# Patient Record
Sex: Male | Born: 1935 | Race: White | Hispanic: No | Marital: Married | Smoking: Former smoker
Health system: Southern US, Community
[De-identification: ages and names within clinical notes are randomized; demographics above are authoritative.]

## PROBLEM LIST (undated history)

## (undated) DIAGNOSIS — J40 Bronchitis, not specified as acute or chronic: Secondary | ICD-10-CM

## (undated) DIAGNOSIS — H3530 Unspecified macular degeneration: Secondary | ICD-10-CM

## (undated) DIAGNOSIS — C4490 Unspecified malignant neoplasm of skin, unspecified: Secondary | ICD-10-CM

## (undated) DIAGNOSIS — I10 Essential (primary) hypertension: Secondary | ICD-10-CM

## (undated) DIAGNOSIS — Z85528 Personal history of other malignant neoplasm of kidney: Secondary | ICD-10-CM

## (undated) DIAGNOSIS — T7840XA Allergy, unspecified, initial encounter: Secondary | ICD-10-CM

## (undated) DIAGNOSIS — M1990 Unspecified osteoarthritis, unspecified site: Secondary | ICD-10-CM

## (undated) DIAGNOSIS — F419 Anxiety disorder, unspecified: Secondary | ICD-10-CM

## (undated) DIAGNOSIS — N289 Disorder of kidney and ureter, unspecified: Secondary | ICD-10-CM

## (undated) HISTORY — DX: Disorder of kidney and ureter, unspecified: N28.9

## (undated) HISTORY — PX: TRANSURETHRAL RESECTION OF PROSTATE: SHX73

## (undated) HISTORY — DX: Bronchitis, not specified as acute or chronic: J40

## (undated) HISTORY — DX: Personal history of other malignant neoplasm of kidney: Z85.528

## (undated) HISTORY — DX: Allergy, unspecified, initial encounter: T78.40XA

## (undated) HISTORY — DX: Unspecified osteoarthritis, unspecified site: M19.90

## (undated) HISTORY — DX: Anxiety disorder, unspecified: F41.9

## (undated) HISTORY — DX: Unspecified macular degeneration: H35.30

## (undated) HISTORY — DX: Unspecified malignant neoplasm of skin, unspecified: C44.90

## (undated) HISTORY — DX: Essential (primary) hypertension: I10

---

## 2005-03-30 ENCOUNTER — Ambulatory Visit: Payer: Self-pay | Admitting: Unknown Physician Specialty

## 2005-09-20 ENCOUNTER — Ambulatory Visit: Payer: Self-pay | Admitting: Internal Medicine

## 2005-09-22 ENCOUNTER — Ambulatory Visit: Payer: Self-pay | Admitting: General Surgery

## 2005-09-22 ENCOUNTER — Other Ambulatory Visit: Payer: Self-pay

## 2005-09-29 ENCOUNTER — Ambulatory Visit: Payer: Self-pay | Admitting: General Surgery

## 2007-01-18 ENCOUNTER — Emergency Department: Payer: Self-pay | Admitting: Emergency Medicine

## 2009-06-30 ENCOUNTER — Ambulatory Visit: Payer: Self-pay | Admitting: Otolaryngology

## 2010-05-20 ENCOUNTER — Ambulatory Visit: Payer: Self-pay

## 2010-06-02 ENCOUNTER — Ambulatory Visit: Payer: Self-pay | Admitting: Unknown Physician Specialty

## 2010-06-22 ENCOUNTER — Ambulatory Visit: Payer: Self-pay | Admitting: Unknown Physician Specialty

## 2011-05-09 ENCOUNTER — Ambulatory Visit: Payer: Self-pay | Admitting: Internal Medicine

## 2011-08-18 ENCOUNTER — Ambulatory Visit: Payer: Self-pay | Admitting: Internal Medicine

## 2011-09-05 ENCOUNTER — Ambulatory Visit: Payer: Self-pay | Admitting: General Practice

## 2011-09-09 ENCOUNTER — Ambulatory Visit: Payer: Self-pay | Admitting: General Practice

## 2013-11-28 ENCOUNTER — Ambulatory Visit: Payer: Self-pay | Admitting: Internal Medicine

## 2013-12-23 ENCOUNTER — Ambulatory Visit: Payer: Self-pay | Admitting: Unknown Physician Specialty

## 2014-01-07 ENCOUNTER — Ambulatory Visit: Payer: Self-pay | Admitting: Internal Medicine

## 2014-05-20 NOTE — Op Note (Signed)
PATIENT NAME:  Jeff Patel, Jeff Patel MR#:  [id number] DATE OF BIRTH:  1928/09/29  DATE OF PROCEDURE:  09/09/2011  PREOPERATIVE DIAGNOSIS: Internal derangement of the right knee.   POSTOPERATIVE DIAGNOSES:  1. Tear of the posterior horn medial meniscus, right knee.  2. Tear of the anterior and posterior horns of the lateral meniscus, right knee.  3. 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 4. Grade III to IV chondromalacia involving the lateral compartment.   PROCEDURES PERFORMED:  1. Right knee arthroscopy. 2. Partial medial and lateral meniscectomies.  3. Chondroplasties of the medial and lateral compartments.   SURGEON: Laurice Record. Holley Bouche., MD   ANESTHESIA: General.   ESTIMATED BLOOD LOSS: Minimal.   TOURNIQUET TIME: Not used.   DRAINS: None.   INDICATIONS FOR SURGERY: The patient is an 81 year old male who has been seen for complaints of right knee pain. MRI demonstrated findings consistent with meniscal pathology as well as some degenerative change. After discussion of the risks and benefits of surgical intervention, the patient expressed his understanding of the risks and benefits and agreed with plans for surgical intervention.   PROCEDURE IN DETAIL: The patient was brought to the operating room and, after adequate general anesthesia was achieved, a tourniquet was placed on the patient's right thigh and the leg was placed in a legholder. All bony prominences were well padded. The patient's right knee and leg were cleaned and prepped with alcohol and DuraPrep and draped in the usual sterile fashion. A "time-out" was performed as per usual protocol. The anticipated portal sites were injected with 0.25% Marcaine with epinephrine. An anterolateral portal was created and a cannula was inserted. A small effusion was evacuated. The scope was inserted and the knee was distended using the DePuy Mitek pump. The scope was advanced down the medial gutter into the medial compartment  of the knee. Under visualization with the scope, an anteromedial portal was created and hook probe was inserted. Inspection of the medial compartment demonstrated a complex tear of the posterior horn of the medial meniscus. This was debrided using meniscal punches and a 4.5 mm shaver. Final contouring was performed using a 50 degree ArthroCare wand. There was noted to be an area of grade IV chondromalacia involving the posteromedial aspect of the medial tibial plateau. Margins were debrided and contoured using the 50 degree ArthroCare wand. There was an associated area of grade IV chondromalacia along the medial aspect of the medial femoral condyle. This area was also debrided and contoured using the ArthroCare wand. Lesser changes were noted to both the femoral condyle and tibial plateau and these were subsequently debrided using the wand. Scope was then advanced into the intercondylar region. Anterior cruciate ligament was visualized and probed and felt to be stable. Scope was removed from the anterolateral portal and reinserted via the anteromedial portal so as to better visualize the lateral compartment. Complex degenerative tear was noted to the anterior horn of the lateral meniscus. This was debrided using a 4.5 mm incisor shaver with additional contouring performed using the 50 degree ArthroCare wand. There was also noted to be significant flap-type tear along the posterior horn of the lateral meniscus. This was debrided using meniscal punches and a 4.5 mm shaver. Contouring of the remaining rim of meniscus was performed using the 50 degree ArthroCare wand. The remaining meniscus was probed and felt to be stable. There was an area of grade III to early grade IV chondromalacia involving the lateral femoral condyle and these areas were debrided  and contoured using the ArthroCare wand. Finally, the scope was positioned so as to visualize the patellofemoral articulation. Good patellar tracking was noted. The  articular surface was in reasonably good condition.   The knee was irrigated with copious amounts of fluid and then suctioned dry. The anterolateral portal was reapproximated using #3-0 nylon. A combination of 0.25% Marcaine with epinephrine and 4 mg of morphine was injected via the scope. The scope was removed and the anteromedial portal was reapproximated using #3-0 nylon. Sterile dressing was applied followed by application of an ice wrap.   The patient tolerated the procedure well. He was transported to the recovery room in stable condition.   ____________________________ Laurice Record. Holley Bouche., MD jph:drc D: 09/09/2011 15:10:08 ET T: 09/09/2011 15:39:00 ET JOB#: [id number] Laurice Record Holley Bouche MD ELECTRONICALLY SIGNED 09/11/2011 9:18

## 2014-08-01 ENCOUNTER — Other Ambulatory Visit: Payer: Self-pay | Admitting: Internal Medicine

## 2014-08-01 DIAGNOSIS — M4310 Spondylolisthesis, site unspecified: Secondary | ICD-10-CM

## 2014-08-12 ENCOUNTER — Ambulatory Visit: Admission: RE | Admit: 2014-08-12 | Payer: Medicare Other | Source: Ambulatory Visit

## 2014-08-20 ENCOUNTER — Ambulatory Visit
Admission: RE | Admit: 2014-08-20 | Discharge: 2014-08-20 | Disposition: A | Discharge status: Home / Self Care | Payer: Medicare Other | Source: Ambulatory Visit | Attending: Internal Medicine | Admitting: Internal Medicine

## 2014-08-20 DIAGNOSIS — M4802 Spinal stenosis, cervical region: Secondary | ICD-10-CM | POA: Diagnosis not present

## 2014-08-20 DIAGNOSIS — M4310 Spondylolisthesis, site unspecified: Secondary | ICD-10-CM | POA: Diagnosis present

## 2014-08-20 MED ORDER — GADOBENATE DIMEGLUMINE 529 MG/ML IV SOLN
20.0000 mL | Freq: Once | INTRAVENOUS | Status: AC | PRN
  Administered 2014-08-20: 20 mL via INTRAVENOUS

## 2014-10-15 ENCOUNTER — Ambulatory Visit: Payer: Medicare Other | Attending: Neurosurgery

## 2014-10-15 VITALS — BP 123/66 | HR 67

## 2014-10-15 DIAGNOSIS — M542 Cervicalgia: Secondary | ICD-10-CM | POA: Insufficient documentation

## 2014-10-15 DIAGNOSIS — R531 Weakness: Secondary | ICD-10-CM | POA: Diagnosis present

## 2014-10-15 NOTE — Therapy (Signed)
[place] [facility] [address], [place], [address] Phone: [phone]   Fax:  [phone]  Physical Therapy Evaluation  Patient Details  Name: Jeff Patel MRN: [id number] Date of Birth: [birth year] Referring Provider:  Karie Chimera, MD  Encounter Date: 10/15/2014      PT End of Session - 10/15/14 1453    Visit Number 1   Number of Visits 9   Date for PT Re-Evaluation 11/13/14   Authorization Type 1   Authorization Time Period of 10   PT Start Time 1454   PT Stop Time 1602   PT Time Calculation (min) 68 min   Activity Tolerance Patient tolerated treatment well   Behavior During Therapy [facility] for tasks assessed/performed      Past Medical History  Diagnosis Date  . Anxiety   . Hypertension     takes medication  . Arthritis     Osteoarthritis  . Allergy   . Bronchitis   . Kidney disease   . History of kidney cancer     Ablation surgery. Has a follow up 11/2014  . Skin cancer   . Macular degeneration     Past Surgical History  Procedure Laterality Date  . Transurethral resection of prostate      Filed Vitals:   10/15/14 1455  BP: 123/66  Pulse: 67    Visit Diagnosis:  Cervicalgia - Plan: PT plan of care cert/re-cert  Weakness - Plan: PT plan of care cert/re-cert      Subjective Assessment - 10/15/14 1455    Subjective Neck pain 2/10 currently. Worst: 8/10 when trying to use the computer, especially when trying to look at the computer screen. Pt has to tilt his head back to see computer through his trifocals. Difficulty turning his head especially looking over his L shoulder.    Pertinent History Pt states that some time back, pt started having problems with his back as  well as difficulty holding his head up. Had an X-ray and MRI which reveals C4, C5, C6 impingement. Was prescribed anti inflammatories which helped with the neck discomfort for the past 3 weeks. MD wants pt to try physical  therapy to help restore posture and take the stress off the impingement.   Denies numbness or tingling.  Neck pain began gradually about 5 months ago.    Patient Stated Goals "I would like to be able to hold up my head and shoulders more errect."   Currently in Pain? Yes   Pain Score 2    Pain Location Neck  and upper back   Pain Orientation --  neck and upper back   Pain Descriptors / Indicators Aching;Burning   Pain Onset More than a month ago   Pain Frequency Constant   Aggravating Factors  neck extension, looking to the L, looking through his trifocals at the computer screen, standing for greater than 15 minutes.    Pain Relieving Factors neck flexion   Multiple Pain Sites No            [facility] PT Assessment - 10/15/14 1519    Assessment   Medical Diagnosis Cervicalgia   Onset Date/Surgical Date 05/02/14  Neck pain began 5 months ago   Hand Dominance Right   Next MD Visit 11/05/2014   Prior Therapy Has not had physical therpay for current condition   Precautions   Precaution Comments Dizziness with end range R cervical rotation with extension.   Restrictions  Other Position/Activity Restrictions no known restrictions   Balance Screen   Has the patient fallen in the past 6 months No   Has the patient had a decrease in activity level because of a fear of falling?  No   Is the patient reluctant to leave their home because of a fear of falling?  No   Prior Function   Vocation Retired   Biomedical scientist PLOF: better able to turn his head to look, hold his head up, work in front of his computer.    Observation/Other Assessments   Observations (+) VBI to the R with dizziness. Pt was recommended to inform his MD pertaining to the dizziness with R cervical extension and rotation to end range. Pt verbalized understanding.    Neck Disability Index  28% (Moderate disability)   Posture/Postural Control   Posture Comments Movement crease at C4/C5. Bilaterally protracted  shoulders, upper thoracic and lower cervical flexion to C5 with R side bend, L cervical side bend starting from C4 up to C2. Atrophy bilateral anterior deltoid   AROM   Overall AROM Comments cervical side bending more uncomforatble than cervical extension   Cervical Flexion 30 degrees   Cervical Extension 40 degrees with neck pain   Cervical - Right Side Bend 15 degrees with neck pain    Cervical - Left Side Bend 15 degrees with neck pain   Cervical - Right Rotation 50 degrees   Cervical - Left Rotation 30 degrees with neck pain   Strength   Overall Strength Comments bilateral shoulder shrug 5/5   Right Shoulder Flexion 4+/5   Right Shoulder ABduction 4/5   Right Shoulder External Rotation 4/5   Left Shoulder Flexion 4+/5   Left Shoulder ABduction 4/5   Left Shoulder External Rotation 4+/5   Right Elbow Flexion 4+/5   Right Elbow Extension 4+/5   Left Elbow Flexion 4+/5   Left Elbow Extension 4/5          [facility] Adult PT Treatment/Exercise - 10/15/14 1519    Exercises   Other Exercises  Directed patient with supine chin tucks 10x5 seconds for 2 sets, then with supine bilateral shoulder flexion 10x2. Reviewed chin tucks in supine and given as part of his HEP. Pt demonstrated and verbalized understanding.             PT Long Term Goals - 10/15/14 2006    PT LONG TERM GOAL #1   Title Pt will be independent with his HEP to help decrease neck pain and improve ability to turn his head.   Time 4   Period Weeks   Status New   PT LONG TERM GOAL #2   Title Patient will have a decrease in neck pain to 5/10 or less at worst to improve ability to hold his head more errect and use his computer.    Time 4   Period Weeks   Status New   PT LONG TERM GOAL #3   Title Patient will improve his Neck Disability Index score by at least  5 points as a demonstration of improved function.    Time 4   Period Weeks   Status New           Plan - 10/15/14 1953    Clinical Impression  Statement Patient is an 81 year old male who came to physical therapy secondary to neck pain which began about 5 months ago (April 2016). He also presents with lower cervical and upper thoracic flexion with R  side bend position, movement preference around the C4/C5 joint, bilaterally protracted shoulders, limited cervical AROM with reproduction of symtoms, and difficulty performing tasks on the computer, as well as turning his head to look. Patient will benefit from  skilled physical therapy services to address the aforementioned deficits.                             Pt also presents with  dizziness during supine R cervical rotation with extension to end range while testing for vertebrobasilar artery insufficiency.  Dizziness subsided when pt returned to the neutral position. Please check. Thank you.   Pt will benefit from skilled therapeutic intervention in order to improve on the following deficits Pain;Hypomobility;Decreased strength;Decreased range of motion   Rehab Potential Good   Clinical Impairments Affecting Rehab Potential age, chronicity of condition   PT Frequency 2x / week   PT Duration 4 weeks   PT Treatment/Interventions Manual techniques;Therapeutic exercise;Therapeutic activities;Moist Heat;Electrical Stimulation;Cryotherapy;Patient/family education   PT Next Visit Plan upper thoracic extension, lower cervical extension, scapular strengthening,    Consulted and Agree with Plan of Care Patient          G-Codes - 10/26/14 2009/05/12    Functional Assessment Tool Used Neck Disability Index, clinical presentation, pt interview   Functional Limitation Carrying, moving and handling objects   Carrying, Moving and Handling Objects Current Status (B3532) At least 20 percent but less than 40 percent impaired, limited or restricted   Carrying, Moving and Handling Objects Goal Status (D9242) At least 1 percent but less than 20 percent impaired, limited or restricted       Problem List There  are no active problems to display for this patient.   Thank you for your referral.   Joneen Boers PT, DPT  10/26/2014, 8:28 PM  [place] [facility] May 12, [address], [place], [address] Phone: [phone]   Fax:  [phone]

## 2014-10-15 NOTE — Patient Instructions (Addendum)
Axial Extension (Chin Tuck)    Lying on your back. Gently pull chin in while lengthening back of neck. Hold __5__ seconds while counting out loud. Repeat __10__ times. Do _3___ sessions per day.  http://gt2.exer.us/645   Copyright  VHI. All rights reserved.   Pt able to perform aforementioned exercise in sitting so long as pt adds shoulder squeezes.  Pt was also recommended to try to sit in a higher chair safely in front of his computer so he does not have to tilt his head up to look at the computer screen using his trifocals. Pt demonstrated and verbalized understanding.   Improved exercise technique, movement at target joints, use of target muscles after mod verbal, visual, tactile cues.

## 2014-10-20 ENCOUNTER — Ambulatory Visit: Payer: Medicare Other

## 2014-10-20 DIAGNOSIS — M542 Cervicalgia: Secondary | ICD-10-CM

## 2014-10-20 DIAGNOSIS — R531 Weakness: Secondary | ICD-10-CM

## 2014-10-20 NOTE — Therapy (Signed)
[place] [facility] [address] [place], [address] Phone: [phone]   Fax:  [phone]  Physical Therapy Treatment  Patient Details  Name: Jeff Patel MRN: [id number] Date of Birth: 01-01-1929 Referring Provider:  Karie Chimera, MD  Encounter Date: 10/20/2014      PT End of Session - 10/20/14 1622    Visit Number 2   Number of Visits 9   Date for PT Re-Evaluation 11/13/14   Authorization Type 2   Authorization Time Period of 10   PT Start Time 1622   PT Stop Time 1704   PT Time Calculation (min) 42 min   Activity Tolerance Patient tolerated treatment well   Behavior During Therapy [facility] for tasks assessed/performed      Past Medical History  Diagnosis Date  . Anxiety   . Hypertension     takes medication  . Arthritis     Osteoarthritis  . Allergy   . Bronchitis   . Kidney disease   . History of kidney cancer     Ablation surgery. Has a follow up 11/2014  . Skin cancer   . Macular degeneration     Past Surgical History  Procedure Laterality Date  . Transurethral resection of prostate      There were no vitals filed for this visit.  Visit Diagnosis:  Cervicalgia  Weakness      Subjective Assessment - 10/20/14 1623    Subjective Still has some discomfort but its better. 3/10 neck discomfort.    Pertinent History Pt states that some time back, pt started having problems with his back as  well as difficulty holding his head up. Had an X-ray and MRI which reveals C4, C5, C6 impingement. Was prescribed anti inflammatories which helped with the neck discomfort for the past 3 weeks. MD wants pt to try physical therapy to help restore posture and take the stress off the impingement.   Denies numbness or tingling.  Neck pain began gradually about 5 months ago.    Patient Stated Goals "I would like to be able to hold up my head and shoulders more errect."   Currently in Pain? Yes   Pain Onset  More than a month ago       Objectives:  Manual therapy to L upper trap and rhomboids muscles.   There-ex:  Directed patient with seated chin tucks 10x5 seconds, then with scapular retraction 10x3 with 5 second holds,   supine bilateral shoulder flexion with chin tucks 10x3 .   Supine bilateral shoulder horizontal abduction with chin tucks 10x2 with 5 second holds,   Standing bilateral scapular retraction yellow latex free band 10x5 seconds (L scapular discomfort which eases with rest),   Seated bilateral shoulder ER with scapular retraction and chin tuck yellow latex free band 10x3.    Improved exercise technique, movement at target joints, use of target muscles after mod verbal, visual, tactile cues.    Decreased L UT and rhomboid muscle tension after soft tissue mobilization. Pt stated feeling upper thoracic extension with exercises.                           PT Education - 10/20/14 1707    Education provided Yes   Education Details ther-ex   [employer]) Educated Patient   Methods Explanation;Demonstration;Verbal cues;Tactile cues   Comprehension Verbalized understanding;Returned demonstration  PT Long Term Goals - 10/15/14 2006    PT LONG TERM GOAL #1   Title Pt will be independent with his HEP to help decrease neck pain and improve ability to turn his head.   Time 4   Period Weeks   Status New   PT LONG TERM GOAL #2   Title Patient will have a decrease in neck pain to 5/10 or less at worst to improve ability to hold his head more errect and use his computer.    Time 4   Period Weeks   Status New   PT LONG TERM GOAL #3   Title Patient will improve his Neck Disability Index score by at least  5 points as a demonstration of improved function.    Time 4   Period Weeks   Status New               Plan - 10/20/14 1638    Clinical Impression Statement Decreased L UT and rhomboid muscle tension after soft tissue  mobilization. Pt stated feeling upper thoracic extension with exercises. Pt states not really having any neck pain after session.    Pt will benefit from skilled therapeutic intervention in order to improve on the following deficits Pain;Hypomobility;Decreased strength;Decreased range of motion   Rehab Potential Good   Clinical Impairments Affecting Rehab Potential age, chronicity of condition   PT Frequency 2x / week   PT Duration 4 weeks   PT Treatment/Interventions Manual techniques;Therapeutic exercise;Therapeutic activities;Moist Heat;Electrical Stimulation;Cryotherapy;Patient/family education   PT Next Visit Plan upper thoracic extension, lower cervical extension, scapular strengthening,    Consulted and Agree with Plan of Care Patient        Problem List There are no active problems to display for this patient.  Joneen Boers PT, DPT  10/20/2014, 5:09 PM  [place] [facility] [address] [place], [address] Phone: [phone]   Fax:  [phone]

## 2014-10-22 ENCOUNTER — Ambulatory Visit: Payer: Medicare Other

## 2014-10-22 DIAGNOSIS — M542 Cervicalgia: Secondary | ICD-10-CM

## 2014-10-22 DIAGNOSIS — R531 Weakness: Secondary | ICD-10-CM

## 2014-10-22 NOTE — Therapy (Signed)
[place] [facility] [address], [place], [address] Phone: [phone]   Fax:  [phone]  Physical Therapy Treatment  Patient Details  Name: Jeff Patel MRN: [id number] Date of Birth: 1928/05/06 Referring Provider:  Karie Chimera, MD  Encounter Date: 10/22/2014      PT End of Session - 10/22/14 1648    Visit Number 3   Number of Visits 9   Date for PT Re-Evaluation 11/13/14   Authorization Type 3   Authorization Time Period of 10   PT Start Time 1648   PT Stop Time 1733   PT Time Calculation (min) 45 min   Activity Tolerance Patient tolerated treatment well   Behavior During Therapy [facility] for tasks assessed/performed      Past Medical History  Diagnosis Date  . Anxiety   . Hypertension     takes medication  . Arthritis     Osteoarthritis  . Allergy   . Bronchitis   . Kidney disease   . History of kidney cancer     Ablation surgery. Has a follow up 11/2014  . Skin cancer   . Macular degeneration     Past Surgical History  Procedure Laterality Date  . Transurethral resection of prostate      There were no vitals filed for this visit.  Visit Diagnosis:  Cervicalgia  Weakness      Subjective Assessment - 10/22/14 1649    Subjective 1-2/10 neck pain. Pt states neck feeling better. Has been doing his exercises at home. Feels soreness at the sides of his neck (R and L lateral around C3 area)   Pertinent History Pt states that some time back, pt started having problems with his back as  well as difficulty holding his head up. Had an X-ray and MRI which reveals C4, C5, C6 impingement. Was prescribed anti inflammatories which helped with the neck discomfort for the past 3 weeks. MD wants pt to try physical therapy to help restore posture and take the stress off the impingement.   Denies numbness or tingling.  Neck pain began gradually about 5 months ago.    Patient Stated Goals "I would like to  be able to hold up my head and shoulders more errect."   Currently in Pain? Yes   Pain Score 2    Pain Onset More than a month ago          Objectives:  Manual therapy to L upper trap levator scapula, and rhomboids muscles.   There-ex:  Directed patient with seated chin tucks with scapular retraction 10x3 with 5 second holds,   Standing R shoulder extension with scapular retraction yellow latex free band 10x3,   Seated bilateral shoulder ER with scapular retraction and chin tucks 10x3,   Seated thoracic extension against chair (arms folded) 10x3 with 5 second holds.   Seated trunk rotation 10x2 each direction to promote spine mobility.  Improved exercise technique, movement at target joints, use of target muscles after mod verbal, visual, tactile cues.           PT Education - 10/22/14 1707    Education provided Yes   Education Details ther-ex   [employer]) Educated Patient   Methods Explanation;Demonstration;Tactile cues;Verbal cues   Comprehension Verbalized understanding;Returned demonstration             PT Long Term Goals - 10/15/14 2006    PT LONG TERM GOAL #1   Title Pt will be independent  with his HEP to help decrease neck pain and improve ability to turn his head.   Time 4   Period Weeks   Status New   PT LONG TERM GOAL #2   Title Patient will have a decrease in neck pain to 5/10 or less at worst to improve ability to hold his head more errect and use his computer.    Time 4   Period Weeks   Status New   PT LONG TERM GOAL #3   Title Patient will improve his Neck Disability Index score by at least  5 points as a demonstration of improved function.    Time 4   Period Weeks   Status New               Plan - 10/22/14 1709    Clinical Impression Statement Decreased L muscle knots to levator scapula, rhomboids an improved lower cervical and upper thoracic position after manual therapy.    Pt will benefit from skilled therapeutic  intervention in order to improve on the following deficits Pain;Hypomobility;Decreased strength;Decreased range of motion   Rehab Potential Good   Clinical Impairments Affecting Rehab Potential age, chronicity of condition   PT Frequency 2x / week   PT Duration 4 weeks   PT Treatment/Interventions Manual techniques;Therapeutic exercise;Therapeutic activities;Moist Heat;Electrical Stimulation;Cryotherapy;Patient/family education   PT Next Visit Plan upper thoracic extension, lower cervical extension, scapular strengthening,    Consulted and Agree with Plan of Care Patient        Problem List There are no active problems to display for this patient.   Joneen Boers PT, DPT   10/22/2014, 5:36 PM  [place] [facility] [address], [place], [address] Phone: [phone]   Fax:  [phone]

## 2014-10-27 ENCOUNTER — Ambulatory Visit: Payer: Medicare Other

## 2014-10-27 DIAGNOSIS — M542 Cervicalgia: Secondary | ICD-10-CM

## 2014-10-27 DIAGNOSIS — R531 Weakness: Secondary | ICD-10-CM

## 2014-10-27 NOTE — Patient Instructions (Addendum)
(  Home) Extension: Cervico-Thoracic (Mid Cervical Support)   Leaning back, tuck chin in. You can fold your arms together (not shown). Extend spine by pressing upper back against chair. Hold for 5 seconds Repeat __10__ times per set. Do __3__ sets per session.    Copyright  VHI. All rights reserved.    Improved exercise technique, movement at target joints, use of target muscles after mod verbal, visual, tactile cues.

## 2014-10-27 NOTE — Therapy (Signed)
[facility] [address], [place], [address] Phone: [phone]   Fax:  [phone]  Physical Therapy Treatment  Patient Details  Name: Jeff Patel MRN: [id number] Date of Birth: 08/01/1928 Referring Provider:  Karie Chimera, MD  Encounter Date: 10/27/2014      PT End of Session - 10/27/14 0908    Visit Number 4   Number of Visits 9   Date for PT Re-Evaluation 11/13/14   Authorization Type 4   Authorization Time Period of 10   PT Start Time 0906   PT Stop Time 0946   PT Time Calculation (min) 40 min   Activity Tolerance Patient tolerated treatment well   Behavior During Therapy [facility] for tasks assessed/performed      Past Medical History  Diagnosis Date  . Anxiety   . Hypertension     takes medication  . Arthritis     Osteoarthritis  . Allergy   . Bronchitis   . Kidney disease   . History of kidney cancer     Ablation surgery. Has a follow up 11/2014  . Skin cancer   . Macular degeneration     Past Surgical History  Procedure Laterality Date  . Transurethral resection of prostate      There were no vitals filed for this visit.  Visit Diagnosis:  Cervicalgia  Weakness      Subjective Assessment - 10/27/14 0908    Subjective 3/10 upper cerivical pain currently. Feels "popping and cracking" L shoulder blade area when he squeezes them. Better able to hold his head up.    Pertinent History Pt states that some time back, pt started having problems with his back as  well as difficulty holding his head up. Had an X-ray and MRI which reveals C4, C5, C6 impingement. Was prescribed anti inflammatories which helped with the neck discomfort for the past 3 weeks. MD wants pt to try physical therapy to help restore posture and take the stress off the impingement.   Denies numbness or tingling.  Neck pain began gradually about 5 months ago.    Patient Stated Goals "I would like to be able to hold  up my head and shoulders more errect."   Multiple Pain Sites No          Objectives:  Manual therapy to L upper trap levator scapula, and rhomboids muscles. Decreased "popping and cracking" palpated with bilateral scapular retraction.    There-ex:  Directed patient with seated chin tucks with scapular retraction 10x with 5 second holds,   Standing R shoulder extension with scapular retraction yellow latex free band 10x3,   Seated R shoulder ER with scapular retraction 10x3 with 5 second holds (to promote neutral rotation upper thoracic spine which is palpated to be slightly rotated to the R) resisting yellow latex free band,   Seated thoracic extension against chair (arms folded) 10x3 with 5 second holds (reviewed and given as part of his HEP; pt demonstrated and verbalized understanding).   Seated trunk rotation 10x2 with 5 second holds each direction to promote spine mobility.    Decreased L posterior scapula popping and cracking with scapular retraction after soft tissue mobilization to L levator scapula and rhomboids. Improved more neutral upper thoracic spine position after R scapular strengthening exercises.                         PT Education - 10/27/14 3500  Education provided Yes   Education Details ther-ex   Person(s) Educated Patient   Methods Explanation;Demonstration;Tactile cues;Verbal cues   Comprehension Verbalized understanding;Returned demonstration             PT Long Term Goals - 10/15/14 2006    PT LONG TERM GOAL #1   Title Pt will be independent with his HEP to help decrease neck pain and improve ability to turn his head.   Time 4   Period Weeks   Status New   PT LONG TERM GOAL #2   Title Patient will have a decrease in neck pain to 5/10 or less at worst to improve ability to hold his head more errect and use his computer.    Time 4   Period Weeks   Status New   PT LONG TERM GOAL #3   Title Patient will improve his  Neck Disability Index score by at least  5 points as a demonstration of improved function.    Time 4   Period Weeks   Status New               Plan - 10/27/14 1610    Clinical Impression Statement Decreased L posterior scapula popping and cracking with scapular retraction after soft tissue mobilization to L levator scapula and rhomboids. Improved more neutral upper thoracic spine position after R scapular strengthening exercises.    Pt will benefit from skilled therapeutic intervention in order to improve on the following deficits Pain;Hypomobility;Decreased strength;Decreased range of motion   Rehab Potential Good   Clinical Impairments Affecting Rehab Potential age, chronicity of condition   PT Frequency 2x / week   PT Duration 4 weeks   PT Treatment/Interventions Manual techniques;Therapeutic exercise;Therapeutic activities;Moist Heat;Electrical Stimulation;Cryotherapy;Patient/family education   PT Next Visit Plan upper thoracic extension, lower cervical extension, scapular strengthening,    Consulted and Agree with Plan of Care Patient        Problem List There are no active problems to display for this patient.  Joneen Boers PT, DPT   10/27/2014, 10:48 AM  [facility] [address], [place], [address] Phone: [phone]   Fax:  [phone]

## 2014-10-30 ENCOUNTER — Ambulatory Visit: Payer: Medicare Other

## 2014-10-30 DIAGNOSIS — R531 Weakness: Secondary | ICD-10-CM

## 2014-10-30 DIAGNOSIS — M542 Cervicalgia: Secondary | ICD-10-CM

## 2014-10-30 NOTE — Therapy (Signed)
[name] [address], [place], [address] Phone: [phone]   Fax:  [phone]  Physical Therapy Treatment  Patient Details  Name: Jeff Patel MRN: [id number] Date of Birth: January 11, 1929 Referring Provider:  Karie Chimera, MD  Encounter Date: 10/30/2014      PT End of Session - 10/30/14 1505    Visit Number 5   Number of Visits 9   Date for PT Re-Evaluation 11/13/14   Authorization Type 5   Authorization Time Period of 10   PT Start Time 1505   PT Stop Time 1547   PT Time Calculation (min) 42 min   Activity Tolerance Patient tolerated treatment well   Behavior During Therapy [facility] for tasks assessed/performed      Past Medical History  Diagnosis Date  . Anxiety   . Hypertension     takes medication  . Arthritis     Osteoarthritis  . Allergy   . Bronchitis   . Kidney disease   . History of kidney cancer     Ablation surgery. Has a follow up 11/2014  . Skin cancer   . Macular degeneration     Past Surgical History  Procedure Laterality Date  . Transurethral resection of prostate      There were no vitals filed for this visit.  Visit Diagnosis:  Cervicalgia  Weakness      Subjective Assessment - 10/30/14 1504    Subjective 2/10 neck pain currently. L shoulder blade still feels popping and cracking when he moves it.   Pertinent History Pt states that some time back, pt started having problems with his back as  well as difficulty holding his head up. Had an X-ray and MRI which reveals C4, C5, C6 impingement. Was prescribed anti inflammatories which helped with the neck discomfort for the past 3 weeks. MD wants pt to try physical therapy to help restore posture and take the stress off the impingement.   Denies numbness or tingling.  Neck pain began gradually about 5 months ago.    Patient Stated Goals "I would like to be able to hold up my head and shoulders more errect."   Currently  in Pain? Yes   Pain Score 2         Objectives:  Manual therapy to L upper trap muscle knot,   There-ex:  Directed patient with seated L upper trap stretch 15 seconds x 6,   Standing R shoulder extension with scapular retraction red (upgrade) latex free band 10x3 with 5 second holds,  Seated R shoulder ER with scapular retraction 10x3 with 5 second holds (to promote neutral position upper thoracic spine which is palpated to be slightly rotated to the R) resisting red (upgrade) latex free band,   Seated trunk rotation with feet propped on stool 10x then 10x2 with 5 second holds each direction to promote spine mobility.  Reviewed progress with L cervical rotation AROM with pt.   Improved exercise technique, movement at target joints, use of target muscles after mod verbal, visual, tactile cues.    Pt demonstrates improved L cervical rotation by 20 degrees since initial evaluation. Still demonstrates L scapular popping sensation. Pt was recommended to try ice to that area for 15 min 4x-5x a day until next session. Pt verbalized understanding.         [facility] PT Assessment - 10/30/14 1535    AROM   Cervical - Right Rotation 50   Cervical -  Left Rotation 50                             PT Education - 10/30/14 1551    Education provided Yes   Education Details ther-ex   Person(s) Educated Patient   Methods Explanation;Demonstration;Tactile cues;Verbal cues   Comprehension Verbalized understanding;Returned demonstration             PT Long Term Goals - 10/15/14 2006    PT LONG TERM GOAL #1   Title Pt will be independent with his HEP to help decrease neck pain and improve ability to turn his head.   Time 4   Period Weeks   Status New   PT LONG TERM GOAL #2   Title Patient will have a decrease in neck pain to 5/10 or less at worst to improve ability to hold his head more errect and use his computer.    Time 4   Period Weeks   Status New   PT LONG  TERM GOAL #3   Title Patient will improve his Neck Disability Index score by at least  5 points as a demonstration of improved function.    Time 4   Period Weeks   Status New               Plan - 10/30/14 1550    Clinical Impression Statement Pt demonstrates improved L cervical rotation by 20 degrees since initial evaluation. Still demonstrates L scapular popping sensation. Pt was recommended to try ice to that area for 15 min 4x-5x a day until next session. Pt verbalized understanding.    Pt will benefit from skilled therapeutic intervention in order to improve on the following deficits Pain;Hypomobility;Decreased strength;Decreased range of motion   Rehab Potential Good   Clinical Impairments Affecting Rehab Potential age, chronicity of condition   PT Frequency 2x / week   PT Duration 4 weeks   PT Treatment/Interventions Manual techniques;Therapeutic exercise;Therapeutic activities;Moist Heat;Electrical Stimulation;Cryotherapy;Patient/family education   PT Next Visit Plan upper thoracic extension, lower cervical extension, scapular strengthening,    Consulted and Agree with Plan of Care Patient        Problem List There are no active problems to display for this patient.   Joneen Boers PT, DPT   10/30/2014, 6:58 PM  [facility] [address], [place], [address] Phone: [phone]   Fax:  [phone]

## 2014-11-04 ENCOUNTER — Ambulatory Visit: Payer: Medicare Other | Attending: Neurosurgery

## 2014-11-04 DIAGNOSIS — M542 Cervicalgia: Secondary | ICD-10-CM | POA: Diagnosis present

## 2014-11-04 DIAGNOSIS — R531 Weakness: Secondary | ICD-10-CM | POA: Diagnosis present

## 2014-11-04 NOTE — Therapy (Signed)
[place] [address], [place], [address] Phone: [phone]   Fax:  [phone]  Physical Therapy Treatment  Patient Details  Name: Jeff Patel MRN: [id number] Date of Birth: [birth year] Referring Provider:  Karie Chimera, MD  Encounter Date: 11/04/2014      PT End of Session - 11/04/14 1519    Visit Number 6   Number of Visits 9   Date for PT Re-Evaluation 11/13/14   Authorization Type 6   Authorization Time Period of 10   PT Start Time 1520   PT Stop Time 1607   PT Time Calculation (min) 47 min   Activity Tolerance Patient tolerated treatment well   Behavior During Therapy [facility] for tasks assessed/performed      Past Medical History  Diagnosis Date  . Anxiety   . Hypertension     takes medication  . Arthritis     Osteoarthritis  . Allergy   . Bronchitis   . Kidney disease   . History of kidney cancer     Ablation surgery. Has a follow up 11/2014  . Skin cancer   . Macular degeneration     Past Surgical History  Procedure Laterality Date  . Transurethral resection of prostate      There were no vitals filed for this visit.  Visit Diagnosis:  Cervicalgia  Weakness      Subjective Assessment - 11/04/14 1521    Subjective Pt states neck is 1-2/10 currently. Pt states that he has bad tissue in his bladder going into the prostate which needs to be removed first week of November 2016. 5/10 neck pain at most for the past 3 days. Uses pillows to sit on his chair and a small stool to prop his feet when sitting on his computer . Helps with neck pain when sitting in front of computer.    Pertinent History Pt states that some time back, pt started having problems with his back as  well as difficulty holding his head up. Had an X-ray and MRI which reveals C4, C5, C6 impingement. Was prescribed anti inflammatories which helped with the neck discomfort for the past 3 weeks. MD wants pt to try  physical therapy to help restore posture and take the stress off the impingement.   Denies numbness or tingling.  Neck pain began gradually about 5 months ago.    Patient Stated Goals "I would like to be able to hold up my head and shoulders more errect."   Currently in Pain? Yes   Pain Score 2       Objectives:  Manual therapy to L levator scapula muscle knot, L rhomboid minor to promote more neutral lower cervical upper thoracic spine position  There-ex:  Directed patient with seated L upper trap stretch 10 seconds x 6,   Standing R shoulder extension with scapular retraction red latex free band 10x3 with 5 second holds,   Seated R shoulder ER with scapular retraction 10x3 with 5 second holds  resisting red latex free band,   Seated trunk rotation with feet propped on stool 10x then 10x2 with 5 second holds each direction to promote spine mobility.    Improved exercise technique, movement at target joints, use of target muscles after min to  mod verbal, visual, tactile cues.      Tolerated session well without aggravation of symptoms. Still demonstrates muscle knot L levator scapula which may be causing the popping sensation with L  scapular retraction. Overall, pt making very good progress towards decreased neck pain.                  PT Education - 11/04/14 1537    Education provided Yes   Education Details ther-ex, HEP   Person(s) Educated Patient   Methods Explanation;Demonstration;Tactile cues;Verbal cues   Comprehension Verbalized understanding;Returned demonstration             PT Long Term Goals - 10/15/14 2006    PT LONG TERM GOAL #1   Title Pt will be independent with his HEP to help decrease neck pain and improve ability to turn his head.   Time 4   Period Weeks   Status New   PT LONG TERM GOAL #2   Title Patient will have a decrease in neck pain to 5/10 or less at worst to improve ability to hold his head more errect and use his computer.     Time 4   Period Weeks   Status New   PT LONG TERM GOAL #3   Title Patient will improve his Neck Disability Index score by at least  5 points as a demonstration of improved function.    Time 4   Period Weeks   Status New               Plan - 11/04/14 1539    Clinical Impression Statement Tolerated session well without aggravation of symptoms. Still demonstrates muscle knot L levator scapula which may be causing the popping sensation with L scapular retraction. Overall, pt making very good progress towards decreased neck pain.    Pt will benefit from skilled therapeutic intervention in order to improve on the following deficits Pain;Hypomobility;Decreased strength;Decreased range of motion   Rehab Potential Good   Clinical Impairments Affecting Rehab Potential age, chronicity of condition   PT Frequency 2x / week   PT Duration 4 weeks   PT Treatment/Interventions Manual techniques;Therapeutic exercise;Therapeutic activities;Moist Heat;Electrical Stimulation;Cryotherapy;Patient/family education   PT Next Visit Plan upper thoracic extension, lower cervical extension, scapular strengthening,    Consulted and Agree with Plan of Care Patient        Problem List There are no active problems to display for this patient.   Joneen Boers PT, DPT   11/04/2014, 4:15 PM  [place] [facility] [address], [place], [address] Phone: [phone]   Fax:  [phone]

## 2014-11-04 NOTE — Patient Instructions (Addendum)
(  Clinic) Extension     Face band, right arm forward. Pull arm down toward side resisting red latex free band. Hold for 5 seconds. Repeat 10___ times per set. Do __3__ sets per session.   Copyright  [employer]. All rights reserved.   Sitting on chair with arms crossed, rotate trunk to the right and to the left comfortably. Do not rotate head. Perform 10 times for 3 sets. You can hold position for 5 seconds.

## 2014-11-06 ENCOUNTER — Ambulatory Visit: Payer: Medicare Other

## 2014-11-06 DIAGNOSIS — M542 Cervicalgia: Secondary | ICD-10-CM

## 2014-11-06 DIAGNOSIS — R531 Weakness: Secondary | ICD-10-CM

## 2014-11-06 NOTE — Patient Instructions (Addendum)
Standing facing the band, pull band back with your right hand. Make sure you squeeze your shoulder blade back. Hold for 5 seconds, repeat 10x. Perform 3 sets.  Standing with red band attached to the wall or door on the left side, keeping your right elbow bent, rotate wrist to the right side as you squeeze your shoulder blade back. Hold for 5 seconds, perform 10 times, do 3 sets.

## 2014-11-06 NOTE — Therapy (Signed)
[place] [facility] [address], [place], [address] Phone: [phone]   Fax:  [phone]  Physical Therapy Treatment  Patient Details  Name: Jeff Patel MRN: [id number] Date of Birth: 1928-08-02 Referring Provider:  Karie Chimera, MD  Encounter Date: 11/06/2014      PT End of Session - 11/06/14 1340    Visit Number 7   Number of Visits 9   Date for PT Re-Evaluation 11/13/14   Authorization Type 7   Authorization Time Period of 10   PT Start Time 1341   PT Stop Time 1428   PT Time Calculation (min) 47 min   Activity Tolerance Patient tolerated treatment well   Behavior During Therapy [facility] for tasks assessed/performed      Past Medical History  Diagnosis Date  . Anxiety   . Hypertension     takes medication  . Arthritis     Osteoarthritis  . Allergy   . Bronchitis   . Kidney disease   . History of kidney cancer     Ablation surgery. Has a follow up 11/2014  . Skin cancer   . Macular degeneration     Past Surgical History  Procedure Laterality Date  . Transurethral resection of prostate      There were no vitals filed for this visit.  Visit Diagnosis:  Cervicalgia  Weakness      Subjective Assessment - 11/06/14 1342    Subjective 1/10 neck pain currently. Standing on his feet for long periods of time (20 min to 30 min), neck and back bothers him.    Pertinent History Pt states that some time back, pt started having problems with his back as  well as difficulty holding his head up. Had an X-ray and MRI which reveals C4, C5, C6 impingement. Was prescribed anti inflammatories which helped with the neck discomfort for the past 3 weeks. MD wants pt to try physical therapy to help restore posture and take the stress off the impingement.   Denies numbness or tingling.  Neck pain began gradually about 5 months ago.    Patient Stated Goals "I would like to be able to hold up my head and shoulders  more errect."   Currently in Pain? Yes   Pain Score 1    Multiple Pain Sites No        Objectives:    There-ex:  Directed patient with standing R shoulder extension with scapular retraction green latex free band 10x2 with 5 second holds,   L upper trap stretch 15 seconds x 6,   R shoulder ER with scapular retraction 10x3 with 5 second holds resisting red latex free band,   Seated trunk rotation with feet propped on stool 10x3 each direction to promote spine mobility.  Standing bilateral horizontal abduction red band 10x with scap retraction,   Ball wall push-ups 10x3,  Seated thoracic extension over chair with arms folded 10x3 with 5 second holds,  Sitting with proper posture and chin tuck with manual perturbation from PT 2x   Improved exercise technique, movement at target joints, use of target muscles after min to mod verbal, visual, tactile cues.   Latex free bands used.   Tolerated session well without complain of increased neck pain. Pt making consistent progress and good carry over with decreasing neck pain from one session to the next. Pt also demonstrates good compliance with his home exercise program.  PT Education - 11/06/14 1353    Education provided Yes   Education Details ther-ex   [employer]) Educated Patient   Methods Explanation;Demonstration;Tactile cues;Verbal cues   Comprehension Verbalized understanding;Returned demonstration             PT Long Term Goals - 10/15/14 2006    PT LONG TERM GOAL #1   Title Pt will be independent with his HEP to help decrease neck pain and improve ability to turn his head.   Time 4   Period Weeks   Status New   PT LONG TERM GOAL #2   Title Patient will have a decrease in neck pain to 5/10 or less at worst to improve ability to hold his head more errect and use his computer.    Time 4   Period Weeks   Status New   PT LONG TERM GOAL #3   Title Patient will improve his  Neck Disability Index score by at least  5 points as a demonstration of improved function.    Time 4   Period Weeks   Status New               Plan - 11/06/14 1339    Clinical Impression Statement Tolerated session well without complain of increased neck pain. Pt making consistent progress and good carry over with decreasing neck pain from one session to the next. Pt also demonstrates good compliance with his home exercise program.    Pt will benefit from skilled therapeutic intervention in order to improve on the following deficits Pain;Hypomobility;Decreased strength;Decreased range of motion   Rehab Potential Good   Clinical Impairments Affecting Rehab Potential age, chronicity of condition   PT Frequency 2x / week   PT Duration 4 weeks   PT Treatment/Interventions Manual techniques;Therapeutic exercise;Therapeutic activities;Moist Heat;Electrical Stimulation;Cryotherapy;Patient/family education   PT Next Visit Plan upper thoracic extension, lower cervical extension, scapular strengthening,    Consulted and Agree with Plan of Care Patient        Problem List There are no active problems to display for this patient.   Joneen Boers PT, DPT   11/06/2014, 8:16 PM  [facility] [address], [place], [address] Phone: [phone]   Fax:  [phone]

## 2014-11-10 ENCOUNTER — Ambulatory Visit: Payer: Medicare Other

## 2014-11-13 ENCOUNTER — Ambulatory Visit: Payer: Medicare Other

## 2014-11-13 DIAGNOSIS — R531 Weakness: Secondary | ICD-10-CM

## 2014-11-13 DIAGNOSIS — M542 Cervicalgia: Secondary | ICD-10-CM

## 2014-11-13 NOTE — Therapy (Signed)
[place] [facility] [address], [place], [address] Phone: [phone]   Fax:  [phone]  Physical Therapy Treatment And Progress Report (10/15/14 - 11/13/14)  Patient Details  Name: Jeff Patel MRN: [id number] Date of Birth: 08/18/1928 Referring Provider:  Karie Chimera, MD  Encounter Date: 11/13/2014      PT End of Session - 11/13/14 1556    Visit Number 8   Number of Visits 13   Date for PT Re-Evaluation 11/27/14   Authorization Type 1   Authorization Time Period of 10   PT Start Time 1556   PT Stop Time 1649   PT Time Calculation (min) 53 min   Activity Tolerance Patient tolerated treatment well   Behavior During Therapy [facility] for tasks assessed/performed      Past Medical History  Diagnosis Date  . Anxiety   . Hypertension     takes medication  . Arthritis     Osteoarthritis  . Allergy   . Bronchitis   . Kidney disease   . History of kidney cancer     Ablation surgery. Has a follow up 11/2014  . Skin cancer   . Macular degeneration     Past Surgical History  Procedure Laterality Date  . Transurethral resection of prostate      There were no vitals filed for this visit.  Visit Diagnosis:  Cervicalgia - Plan: PT plan of care cert/re-cert  Weakness - Plan: PT plan of care cert/re-cert      Subjective Assessment - 11/13/14 1558    Subjective Pt states neck seems a little tight. Pt states that he was vacumming this morning which might have lit up his sciatic nerve on L LE. Neck pain 2/10 currently.  4/10 neck pain/discomfort at most for the past 3 days.  Also feels like he is better able to hold his neck up.  Leaning forward helps his sciatic nerve   Pertinent History Pt states that some time back, pt started having problems with his back as  well as difficulty holding his head up. Had an X-ray and MRI which reveals C4, C5, C6 impingement. Was prescribed anti inflammatories which  helped with the neck discomfort for the past 3 weeks. MD wants pt to try physical therapy to help restore posture and take the stress off the impingement.   Denies numbness or tingling.  Neck pain began gradually about 5 months ago.    Patient Stated Goals "I would like to be able to hold up my head and shoulders more errect."   Currently in Pain? Yes   Pain Score 2    Multiple Pain Sites No        Manual therapy:   Soft tissue mobilization to L rhomboid (improved L cervical rotation to 50 degrees)  There-ex:  Directed patient with seated cervical flexion, extension, R and L side bend, R and L rotation. Reviewed progress/current status with cervical ROM with pt.   Directed patient with standing R shoulder extension with scapular retraction green latex free band 10x2 with 5 second holds, Seated thoracic extension over chair with arms folded 10x3 with 5 second holds,  L upper trap stretch 15 seconds x 6,  Sitting with proper posture and chin tuck with manual perturbation from PT 3x Pt was recommended to hold off on seated trunk rotation exercises at home for now secondary to his sciatic discomfort. Pt verbalized understanding.   Latex free bands used.   Improved  exercise technique, movement at target joints, use of target muscles after mod verbal, visual, tactile cues.   Pt was recommended to transfer his wallet from his L back pocket to his L front pocket (decreased sciatic symptoms) and instructed to perform seated hip adduction pillow squeezes 10x 5 seconds. No sciatic pain afterwards.      Pt has demonstrated overall improved ability to perform and maintain proper neck posture (lower cervical and upper thoracic extension) and decreased overall neck pain since initial evaluation decreasing from 8/10 to 4/10 at worst. Pt will benefit from continued skilled physical therapy services for 2 more weeks to continue decreasing pain level, improve ability to turn his head with less  discomfort and to be able to increase ability to maintain proper posture when performing tasks at home such as working at his computer.            [facility] PT Assessment - 11/13/14 1556    Observation/Other Assessments   Neck Disability Index  20 % (measured 11/06/14)   AROM   Cervical Flexion 33 degrees   Cervical Extension 46 degrees   Cervical - Right Side Bend 10  with neck discomfort   Cervical - Left Side Bend 11  with neck discomfort (less than R side bending)   Cervical - Right Rotation 60   Cervical - Left Rotation 50  after manual therapy to L rhomboid muscles                             PT Education - 11/13/14 1804    Education provided Yes   Education Details ther-ex, progress/current status, plan of care   Person(s) Educated Patient   Methods Explanation;Demonstration;Tactile cues;Verbal cues   Comprehension Verbalized understanding;Returned demonstration             PT Long Term Goals - 11/13/14 2001    PT LONG TERM GOAL #1   Title Pt will be independent with his HEP to help decrease neck pain and improve ability to turn his head.   Time 4   Period Weeks   Status On-going   PT LONG TERM GOAL #2   Title Patient will have a decrease in neck pain to 5/10 or less at worst to improve ability to hold his head more errect and use his computer.    Time 4   Period Weeks   Status Achieved   PT LONG TERM GOAL #3   Title Patient will improve his Neck Disability Index score by at least  5 points as a demonstration of improved function.    Time 4   Period Weeks   Status On-going               Plan - 11/13/14 1805    Clinical Impression Statement Pt has demonstrated overall improved ability to perform and maintain proper neck posture (lower cervical and upper thoracic extension) and decreased overall neck pain since initial evaluation decreasing from 8/10 to 4/10 at worst. Pt will benefit from continued skilled physical therapy services  for 2 more weeks to continue decreasing pain level, improve ability to turn his head with less discomfort and to be able to increase ability to maintain proper posture when performing tasks at home such as working at his computer.    Pt will benefit from skilled therapeutic intervention in order to improve on the following deficits Pain;Hypomobility;Decreased strength;Decreased range of motion   Rehab Potential Good   Clinical  Impairments Affecting Rehab Potential age, chronicity of condition   PT Frequency 2x / week   PT Duration 2 weeks   PT Treatment/Interventions Manual techniques;Therapeutic exercise;Therapeutic activities;Moist Heat;Electrical Stimulation;Cryotherapy;Patient/family education   PT Next Visit Plan upper thoracic extension, lower cervical extension, scapular strengthening,    Consulted and Agree with Plan of Care Patient          G-Codes - 11/14/14 1957    Functional Assessment Tool Used Neck Disability Index, clinical presentation, pt interview   Functional Limitation Carrying, moving and handling objects   Carrying, Moving and Handling Objects Current Status (Y4825) At least 20 percent but less than 40 percent impaired, limited or restricted   Carrying, Moving and Handling Objects Goal Status (O0370) At least 1 percent but less than 20 percent impaired, limited or restricted      Problem List There are no active problems to display for this patient.  Thank you for your referral.  Joneen Boers PT, DPT   11-14-14, 8:05 PM  [place] [facility] [address], [place], [address] Phone: [phone]   Fax:  [phone]

## 2014-11-18 ENCOUNTER — Ambulatory Visit: Payer: Medicare Other

## 2014-11-18 DIAGNOSIS — M542 Cervicalgia: Secondary | ICD-10-CM | POA: Diagnosis not present

## 2014-11-18 DIAGNOSIS — R531 Weakness: Secondary | ICD-10-CM

## 2014-11-18 NOTE — Therapy (Signed)
[place] [facility] [address], [place], [address] Phone: [phone]   Fax:  [phone]  Physical Therapy Treatment  Patient Details  Name: Jeff Patel MRN: [id number] Date of Birth: 02-19-1928 No Data Recorded  Encounter Date: 11/18/2014      PT End of Session - 11/18/14 1259    Visit Number 9   Number of Visits 13   Date for PT Re-Evaluation 11/27/14   Authorization Type 2   Authorization Time Period of 10   PT Start Time 1300   PT Stop Time 1348   PT Time Calculation (min) 48 min   Activity Tolerance Patient tolerated treatment well   Behavior During Therapy [facility] for tasks assessed/performed      Past Medical History  Diagnosis Date  . Anxiety   . Hypertension     takes medication  . Arthritis     Osteoarthritis  . Allergy   . Bronchitis   . Kidney disease   . History of kidney cancer     Ablation surgery. Has a follow up 11/2014  . Skin cancer   . Macular degeneration     Past Surgical History  Procedure Laterality Date  . Transurethral resection of prostate      There were no vitals filed for this visit.  Visit Diagnosis:  Cervicalgia  Weakness      Subjective Assessment - 11/18/14 1303    Subjective Pt states that his neck feels stiff but its better. Rescheduled his MD appointment to 2 weeks later.  1/10 neck pain currently. The sciatic pain L LE is pretty much alleviated since last session. Pt states that he usually feels better after physical therapy.   Pertinent History Pt states that some time back, pt started having problems with his back as  well as difficulty holding his head up. Had an X-ray and MRI which reveals C4, C5, C6 impingement. Was prescribed anti inflammatories which helped with the neck discomfort for the past 3 weeks. MD wants pt to try physical therapy to help restore posture and take the stress off the impingement.   Denies numbness or tingling.  Neck pain  began gradually about 5 months ago.    Patient Stated Goals "I would like to be able to hold up my head and shoulders more errect."   Currently in Pain? Yes   Pain Score 1       Manual therapy:   Soft tissue mobilization to L rhomboid muscle   There-ex:  Directed patient with seated thoracic extension over chair with arms folded 10x3 with 5 second holds,  Supine bilateral shoulder horizontal abduction to promote thoracic extension 10x2,  L upper trap stretch 15 seconds x 6,  Doorway pectoralis stretch (to promote scapular retraction and thoracic extension) 15 seconds x 5, standing R shoulder extension with scapular retraction green latex free band 10x2 with 5 second holds (to promote neutral spine position), proper posture and chin tuck with manual perturbation from PT 9x (to promote strength and endurance to maintain proper posture)   Improved exercise technique, movement at target joints, use of target muscles after mod verbal, visual, tactile cues.    Pt demonstrates consistent decrease in cervical pain level at beginning of sessions. Patient making good progress towards goals.         PT Education - 11/18/14 1326    Education provided Yes   Education Details ther-ex   [employer]) Educated Patient   Methods Explanation;Demonstration;Tactile  cues;Verbal cues   Comprehension Verbalized understanding;Returned demonstration             PT Long Term Goals - 11/13/14 2001    PT LONG TERM GOAL #1   Title Pt will be independent with his HEP to help decrease neck pain and improve ability to turn his head.   Time 4   Period Weeks   Status On-going   PT LONG TERM GOAL #2   Title Patient will have a decrease in neck pain to 5/10 or less at worst to improve ability to hold his head more errect and use his computer.    Time 4   Period Weeks   Status Achieved   PT LONG TERM GOAL #3   Title Patient will improve his Neck Disability Index score by at least  5 points as a  demonstration of improved function.    Time 4   Period Weeks   Status On-going               Plan - 11/18/14 1327    Clinical Impression Statement Pt demonstrates consistent decrease in cervical pain level at beginning of sessions. Patient making good progress towards goals.    Pt will benefit from skilled therapeutic intervention in order to improve on the following deficits Pain;Hypomobility;Decreased strength;Decreased range of motion   Rehab Potential Good   Clinical Impairments Affecting Rehab Potential age, chronicity of condition   PT Frequency 2x / week   PT Duration 2 weeks   PT Treatment/Interventions Manual techniques;Therapeutic exercise;Therapeutic activities;Moist Heat;Electrical Stimulation;Cryotherapy;Patient/family education   PT Next Visit Plan upper thoracic extension, lower cervical extension, scapular strengthening,    Consulted and Agree with Plan of Care Patient        Problem List There are no active problems to display for this patient.   Joneen Boers PT, DPT   11/18/2014, 2:17 PM  [place] [facility] [address], [place], [address] Phone: [phone]   Fax:  [phone]  Name: Jeff Patel MRN: [id number] Date of Birth: November 29, 1928

## 2014-11-20 ENCOUNTER — Ambulatory Visit: Payer: Medicare Other

## 2014-11-20 DIAGNOSIS — R531 Weakness: Secondary | ICD-10-CM

## 2014-11-20 DIAGNOSIS — M542 Cervicalgia: Secondary | ICD-10-CM

## 2014-11-20 NOTE — Therapy (Signed)
[place] [facility] [address], [place], [address] Phone: [phone]   Fax:  [phone]  Physical Therapy Treatment  Patient Details  Name: Jeff Patel MRN: [id number] Date of Birth: 1928-09-16 No Data Recorded  Encounter Date: 11/20/2014      PT End of Session - 11/20/14 1440    Visit Number 10   Number of Visits 13   Date for PT Re-Evaluation 11/27/14   Authorization Type 3   Authorization Time Period of 10   PT Start Time 4010   PT Stop Time 1521   PT Time Calculation (min) 40 min   Activity Tolerance Patient tolerated treatment well   Behavior During Therapy [facility] for tasks assessed/performed      Past Medical History  Diagnosis Date  . Anxiety   . Hypertension     takes medication  . Arthritis     Osteoarthritis  . Allergy   . Bronchitis   . Kidney disease   . History of kidney cancer     Ablation surgery. Has a follow up 11/2014  . Skin cancer   . Macular degeneration     Past Surgical History  Procedure Laterality Date  . Transurethral resection of prostate      There were no vitals filed for this visit.  Visit Diagnosis:  Cervicalgia  Weakness      Subjective Assessment - 11/20/14 1441    Subjective 1/10 neck pain currently. Better able to hold his neck up errect. Gets tired however when standing.    Pertinent History Pt states that some time back, pt started having problems with his back as  well as difficulty holding his head up. Had an X-ray and MRI which reveals C4, C5, C6 impingement. Was prescribed anti inflammatories which helped with the neck discomfort for the past 3 weeks. MD wants pt to try physical therapy to help restore posture and take the stress off the impingement.   Denies numbness or tingling.  Neck pain began gradually about 5 months ago.    Patient Stated Goals "I would like to be able to hold up my head and shoulders more errect."   Currently in Pain? Yes    Pain Score 1      Objectives:   Manual therapy:   Soft tissue mobilization to R levator scapula muscle,  L rhomboid muscle   There-ex:  Directed patient with proper posture and chin tuck with manual perturbation from PT 10x (to promote strength and endurance to maintain proper posture) L upper trap stretch 15 seconds x 5,  seated thoracic extension over chair with arms folded 10x with 5 second holds,  standing R shoulder extension with scapular retraction green latex free band 10x2 with 5 second holds (to promote neutral spine position), Doorway pectoralis stretch (to promote scapular retraction and thoracic extension) 15 seconds x 5, Seated bilateral shoulder ER with scapular retraction resisting red latex free band 10x   Latex free bands utilized.  Improved exercise technique, movement at target joints, use of target muscles after mod verbal, visual, tactile cues.    Tolerated session well without complain of pain. Pt states feeling better after session.                            PT Education - 11/20/14 1509    Education provided Yes   Education Details ther-ex   [employer]) Educated Patient   Methods  Explanation;Demonstration;Tactile cues;Verbal cues   Comprehension Verbalized understanding;Returned demonstration             PT Long Term Goals - 11/13/14 2001    PT LONG TERM GOAL #1   Title Pt will be independent with his HEP to help decrease neck pain and improve ability to turn his head.   Time 4   Period Weeks   Status On-going   PT LONG TERM GOAL #2   Title Patient will have a decrease in neck pain to 5/10 or less at worst to improve ability to hold his head more errect and use his computer.    Time 4   Period Weeks   Status Achieved   PT LONG TERM GOAL #3   Title Patient will improve his Neck Disability Index score by at least  5 points as a demonstration of improved function.    Time 4   Period Weeks   Status On-going                Plan - 11/20/14 1509    Clinical Impression Statement Tolerated session well without complain of pain. Pt states feeling better after session.    Pt will benefit from skilled therapeutic intervention in order to improve on the following deficits Pain;Hypomobility;Decreased strength;Decreased range of motion   Rehab Potential Good   Clinical Impairments Affecting Rehab Potential age, chronicity of condition   PT Frequency 2x / week   PT Duration 2 weeks   PT Treatment/Interventions Manual techniques;Therapeutic exercise;Therapeutic activities;Moist Heat;Electrical Stimulation;Cryotherapy;Patient/family education   PT Next Visit Plan upper thoracic extension, lower cervical extension, scapular strengthening,    Consulted and Agree with Plan of Care Patient        Problem List There are no active problems to display for this patient.  Joneen Boers PT, DPT  11/20/2014, 7:08 PM  [place] [facility] [address], [place], [address] Phone: [phone]   Fax:  [phone]  Name: Jeff Patel MRN: [id number] Date of Birth: 09-11-1928

## 2014-11-24 ENCOUNTER — Ambulatory Visit: Payer: Medicare Other

## 2014-11-24 DIAGNOSIS — R531 Weakness: Secondary | ICD-10-CM

## 2014-11-24 DIAGNOSIS — M542 Cervicalgia: Secondary | ICD-10-CM | POA: Diagnosis not present

## 2014-11-24 NOTE — Therapy (Signed)
[place] [facility] [address] [place], [address] Phone: [phone]   Fax:  [phone]  Physical Therapy Treatment  Patient Details  Name: Jeff Patel MRN: [id number] Date of Birth: Jul 12, 1928 No Data Recorded  Encounter Date: 11/24/2014      PT End of Session - 11/24/14 1429    Visit Number 11   Number of Visits 13   Date for PT Re-Evaluation 11/27/14   Authorization Type 4   Authorization Time Period of 10   PT Start Time 1430   PT Stop Time 1516   PT Time Calculation (min) 46 min   Activity Tolerance Patient tolerated treatment well   Behavior During Therapy [facility] for tasks assessed/performed      Past Medical History  Diagnosis Date  . Anxiety   . Hypertension     takes medication  . Arthritis     Osteoarthritis  . Allergy   . Bronchitis   . Kidney disease   . History of kidney cancer     Ablation surgery. Has a follow up 11/2014  . Skin cancer   . Macular degeneration     Past Surgical History  Procedure Laterality Date  . Transurethral resection of prostate      There were no vitals filed for this visit.  Visit Diagnosis:  Cervicalgia  Weakness      Subjective Assessment - 11/24/14 1430    Subjective Neck is doing pretty good. Very little discomfort. Standing, walking for any length of time (such as in the super market) bothers his neck at times.    Pertinent History Pt states that some time back, pt started having problems with his back as  well as difficulty holding his head up. Had an X-ray and MRI which reveals C4, C5, C6 impingement. Was prescribed anti inflammatories which helped with the neck discomfort for the past 3 weeks. MD wants pt to try physical therapy to help restore posture and take the stress off the impingement.   Denies numbness or tingling.  Neck pain began gradually about 5 months ago.    Patient Stated Goals "I would like to be able to hold up my head and  shoulders more errect."   Currently in Pain? Yes   Pain Score --  no pain level provided      Objectives:   Manual therapy:   Soft tissue mobilization to L rhomboid muscle   There-ex:  Directed patient with L upper trap stretch 20 seconds x 5, seated thoracic extension over chair with arms folded 10x with 10 second holds, then another 10x with 15 second holds (upgraded hold time to promote endurance to promote ability to perform tasks with increased ability to maintain proper posture for longer periods of time),   proper posture and chin tuck with manual perturbation from PT 10x (to promote strength and endurance to maintain proper posture) Doorway pectoralis stretch (to promote scapular retraction and thoracic extension) 20 seconds x 5 (increased hold time), standing R shoulder extension with scapular retraction green latex free band 10x2 with 5 second holds (to promote neutral spine position),  Improved exercise technique, movement at target joints, use of target muscles after mod verbal, visual, tactile cues.    Increased hold times with thoracic extension exercise with chin tucks at chair to promote endurance maintaining proper posture. Tolerated session without complain of increased pain.         [facility] PT Assessment - 11/24/14 1532  Observation/Other Assessments   Neck Disability Index  20% (moderate disability)                             PT Education - 11/24/14 1445    Education provided Yes   Education Details ther-ex   [employer]) Educated Patient   Methods Explanation;Demonstration;Tactile cues;Verbal cues   Comprehension Verbalized understanding;Returned demonstration             PT Long Term Goals - 11/13/14 2001    PT LONG TERM GOAL #1   Title Pt will be independent with his HEP to help decrease neck pain and improve ability to turn his head.   Time 4   Period Weeks   Status On-going   PT LONG TERM GOAL #2   Title Patient will  have a decrease in neck pain to 5/10 or less at worst to improve ability to hold his head more errect and use his computer.    Time 4   Period Weeks   Status Achieved   PT LONG TERM GOAL #3   Title Patient will improve his Neck Disability Index score by at least  5 points as a demonstration of improved function.    Time 4   Period Weeks   Status On-going               Plan - 11/24/14 1428    Clinical Impression Statement Increased hold times with thoracic extension exercise with chin tucks at chair to promote endurance maintaining proper posture. Tolerated session without complain of increased pain.    Pt will benefit from skilled therapeutic intervention in order to improve on the following deficits Pain;Hypomobility;Decreased strength;Decreased range of motion   Rehab Potential Good   Clinical Impairments Affecting Rehab Potential age, chronicity of condition   PT Frequency 2x / week   PT Duration 2 weeks   PT Treatment/Interventions Manual techniques;Therapeutic exercise;Therapeutic activities;Moist Heat;Electrical Stimulation;Cryotherapy;Patient/family education   PT Next Visit Plan upper thoracic extension, lower cervical extension, scapular strengthening,    Consulted and Agree with Plan of Care Patient        Problem List There are no active problems to display for this patient.   Joneen Boers PT, DPT   11/24/2014, 3:36 PM  [place] [facility] [address] [place], [address] Phone: [phone]   Fax:  [phone]  Name: Jeff Patel MRN: [id number] Date of Birth: [birth year]

## 2014-11-24 NOTE — Patient Instructions (Signed)
Pt was recommended to increase his hold times for his seated thoracic extension exercise at chair to 10-15 seconds for 3 sets daily to promote endurance maintaining proper posture. Pt demonstrated and verbalized understanding.

## 2014-11-26 ENCOUNTER — Ambulatory Visit: Payer: Medicare Other

## 2014-11-26 DIAGNOSIS — R531 Weakness: Secondary | ICD-10-CM

## 2014-11-26 DIAGNOSIS — M542 Cervicalgia: Secondary | ICD-10-CM

## 2014-11-26 NOTE — Therapy (Signed)
[facility] [address] [place], [address] Phone: [phone]   Fax:  [phone]  Physical Therapy Treatment And Discharge Summary (10/15/14 - 11/26/14)  Patient Details  Name: Jeff Patel MRN: [id number] Date of Birth: [birth year] Referring Provider: Karie Chimera, MD  Encounter Date: 11/26/2014      PT End of Session - 11/26/14 1429    Visit Number 12   Number of Visits 13   Date for PT Re-Evaluation 11/27/14   Authorization Type 5   Authorization Time Period of 10   PT Start Time 1429   PT Stop Time 1518   PT Time Calculation (min) 49 min   Activity Tolerance Patient tolerated treatment well   Behavior During Therapy [facility] for tasks assessed/performed      Past Medical History  Diagnosis Date  . Anxiety   . Hypertension     takes medication  . Arthritis     Osteoarthritis  . Allergy   . Bronchitis   . Kidney disease   . History of kidney cancer     Ablation surgery. Has a follow up 11/2014  . Skin cancer   . Macular degeneration     Past Surgical History  Procedure Laterality Date  . Transurethral resection of prostate      There were no vitals filed for this visit.  Visit Diagnosis:  Cervicalgia  Weakness      Subjective Assessment - 11/26/14 1430    Subjective 1/10 neck pain currently. 3-4/10 at worst for the past week.    Pertinent History Pt states that some time back, pt started having problems with his back as  well as difficulty holding his head up. Had an X-ray and MRI which reveals C4, C5, C6 impingement. Was prescribed anti inflammatories which helped with the neck discomfort for the past 3 weeks. MD wants pt to try physical therapy to help restore posture and take the stress off the impingement.   Denies numbness or tingling.  Neck pain began gradually about 5 months ago.    Patient Stated Goals "I would like to be able to hold up my head and shoulders more  errect."   Currently in Pain? Yes   Pain Score 1    Multiple Pain Sites No      Manual therapy:   Soft tissue mobilization to L rhomboid muscle   There-ex:  Directed patient with sitting with proper posture and chin tuck with manual perturbation from PT 10x (to promote strength and endurance to maintain proper posture)  L upper trap stretch 20 seconds x 5, Seated L and R cervical rotation 1-2x each direction, Reviewed progress with cervical rotation AROM with pt. Directed patient with seated thoracic extension over chair with arms folded 10x with 10 second holds, then another 10x with 15 second holds Doorway pectoralis stretch (to promote scapular retraction and thoracic extension) 20 seconds x 5  standing R shoulder extension with scapular retraction green latex free band 10x2 with 5 second holds (to promote neutral spine position), cues for scapular retraction Standing L shoulder extension resisting red band 10x2 with 5 second holds (decreased L scapular popping sensation with scapular retraction; reviewed and given as part of his HEP; pt demonstrated and verbalized understanding)   Improved exercise technique, movement at target joints, use of target muscles after mod verbal, visual, tactile cues.    Patient has demonstrated consisted decrease in neck pain as well as improved cervical rotation AROM  since initial evaluation. Pt also reports being better able to hold his head up. Patient has made very good progress with physical therapy towards decreasing neck pain and improving ability to maintain proper cervical and upper thoracic posture. Skilled physical therapy services discharged with patient continuing progress with his home exercise program.               [facility] PT Assessment - December 21, 2014 1451    Assessment   Referring Provider Karie Chimera, MD   AROM   Cervical - Right Rotation 70   Cervical - Left Rotation 60                             PT  Education - 12-21-14 1451    Education provided Yes   Education Details ther-ex, cervical AROM rotation progress   Person(s) Educated Patient   Methods Explanation;Demonstration;Tactile cues;Verbal cues   Comprehension Verbalized understanding;Returned demonstration             PT Long Term Goals - 12/21/14 1453    PT LONG TERM GOAL #1   Title Pt will be independent with his HEP to help decrease neck pain and improve ability to turn his head.   Time 4   Period Weeks   Status Achieved   PT LONG TERM GOAL #2   Title Patient will have a decrease in neck pain to 5/10 or less at worst to improve ability to hold his head more errect and use his computer.    Time 4   Period Weeks   Status Achieved   PT LONG TERM GOAL #3   Title Patient will improve his Neck Disability Index score by at least  5 points as a demonstration of improved function.    Time 4   Period Weeks   Status On-going               Plan - 21-Dec-2014 1452    Clinical Impression Statement Patient has demonstrated consisted decrease in neck pain as well as improved cervical rotation AROM since initial evaluation. Pt also reports being better able to hold his head up. Patient has made very good progress with physical therapy towards decreasing neck pain and improving ability to maintain proper cervical and upper thoracic posture. Skilled physical therapy services discharged with patient continuing progress with his home exercise program.   Pt will benefit from skilled therapeutic intervention in order to improve on the following deficits Pain;Hypomobility;Decreased strength;Decreased range of motion   Rehab Potential Good   Clinical Impairments Affecting Rehab Potential age, chronicity of condition   PT Treatment/Interventions Manual techniques;Therapeutic exercise;Therapeutic activities;Moist Heat;Electrical Stimulation;Cryotherapy;Patient/family education   PT Next Visit Plan Patient to continue progress with his  home exercise program   Consulted and Agree with Plan of Care Patient          G-Codes - 12/21/14 1745    Functional Assessment Tool Used Neck Disability Index, clinical presentation, pt interview   Functional Limitation Carrying, moving and handling objects   Carrying, Moving and Handling Objects Goal Status ([id number]) At least 1 percent but less than 20 percent impaired, limited or restricted   Carrying, Moving and Handling Objects Discharge Status [phone]) At least 20 percent but less than 40 percent impaired, limited or restricted      Problem List There are no active problems to display for this patient.  Thank you for your referral.  Joneen Boers PT, DPT   12/21/14, 5:52  PM  [facility] [address], [place], [address] Phone: [phone]   Fax:  [phone]  Name: Jeff Patel MRN: [id number] Date of Birth: 05/25/1928

## 2015-06-18 ENCOUNTER — Other Ambulatory Visit
Admission: RE | Admit: 2015-06-18 | Discharge: 2015-06-18 | Disposition: A | Discharge status: Home / Self Care | Payer: Medicare Other | Source: Ambulatory Visit | Attending: Ophthalmology | Admitting: Ophthalmology

## 2015-06-18 DIAGNOSIS — H532 Diplopia: Secondary | ICD-10-CM | POA: Insufficient documentation

## 2015-06-18 LAB — CBC
HEMATOCRIT: 37.5 % — AB (ref 40.0–52.0)
HEMOGLOBIN: 12.4 g/dL — AB (ref 13.0–18.0)
MCH: 28.3 pg (ref 26.0–34.0)
MCHC: 33.1 g/dL (ref 32.0–36.0)
MCV: 85.5 fL (ref 80.0–100.0)
Platelets: 603 10*3/uL — ABNORMAL HIGH (ref 150–440)
RBC: 4.38 MIL/uL — ABNORMAL LOW (ref 4.40–5.90)
RDW: 15.1 % — ABNORMAL HIGH (ref 11.5–14.5)
WBC: 8.1 10*3/uL (ref 3.8–10.6)

## 2015-06-19 LAB — ACETYLCHOLINE RECEPTOR, BINDING: ACETYLCHOLINE BINDING AB: 6.65 nmol/L — AB (ref 0.00–0.24)

## 2015-08-10 ENCOUNTER — Other Ambulatory Visit: Payer: Self-pay | Admitting: Neurology

## 2015-08-10 DIAGNOSIS — G7000 Myasthenia gravis without (acute) exacerbation: Secondary | ICD-10-CM

## 2015-08-18 ENCOUNTER — Ambulatory Visit: Payer: Medicare Other

## 2015-08-19 ENCOUNTER — Ambulatory Visit
Admission: RE | Admit: 2015-08-19 | Discharge: 2015-08-19 | Disposition: A | Discharge status: Home / Self Care | Payer: Medicare Other | Source: Ambulatory Visit | Attending: Neurology | Admitting: Neurology

## 2015-08-19 DIAGNOSIS — G7000 Myasthenia gravis without (acute) exacerbation: Secondary | ICD-10-CM | POA: Diagnosis present

## 2015-08-31 ENCOUNTER — Inpatient Hospital Stay: Payer: Medicare Other

## 2015-08-31 ENCOUNTER — Encounter: Payer: Self-pay | Admitting: Oncology

## 2015-08-31 ENCOUNTER — Inpatient Hospital Stay: Payer: Medicare Other | Attending: Oncology | Admitting: Oncology

## 2015-08-31 VITALS — BP 129/77 | HR 80 | Temp 95.1°F | Resp 17 | Ht 71.0 in | Wt 223.2 lb

## 2015-08-31 DIAGNOSIS — Z85828 Personal history of other malignant neoplasm of skin: Secondary | ICD-10-CM | POA: Insufficient documentation

## 2015-08-31 DIAGNOSIS — M1990 Unspecified osteoarthritis, unspecified site: Secondary | ICD-10-CM

## 2015-08-31 DIAGNOSIS — R634 Abnormal weight loss: Secondary | ICD-10-CM | POA: Insufficient documentation

## 2015-08-31 DIAGNOSIS — D75839 Thrombocytosis, unspecified: Secondary | ICD-10-CM

## 2015-08-31 DIAGNOSIS — Z85528 Personal history of other malignant neoplasm of kidney: Secondary | ICD-10-CM | POA: Insufficient documentation

## 2015-08-31 DIAGNOSIS — D473 Essential (hemorrhagic) thrombocythemia: Secondary | ICD-10-CM | POA: Insufficient documentation

## 2015-08-31 DIAGNOSIS — R531 Weakness: Secondary | ICD-10-CM

## 2015-08-31 DIAGNOSIS — I10 Essential (primary) hypertension: Secondary | ICD-10-CM | POA: Diagnosis not present

## 2015-08-31 DIAGNOSIS — Z87891 Personal history of nicotine dependence: Secondary | ICD-10-CM | POA: Diagnosis not present

## 2015-08-31 DIAGNOSIS — R0600 Dyspnea, unspecified: Secondary | ICD-10-CM | POA: Insufficient documentation

## 2015-08-31 DIAGNOSIS — Z79899 Other long term (current) drug therapy: Secondary | ICD-10-CM

## 2015-08-31 DIAGNOSIS — Z7982 Long term (current) use of aspirin: Secondary | ICD-10-CM | POA: Insufficient documentation

## 2015-08-31 DIAGNOSIS — K521 Toxic gastroenteritis and colitis: Secondary | ICD-10-CM | POA: Diagnosis not present

## 2015-08-31 DIAGNOSIS — G7000 Myasthenia gravis without (acute) exacerbation: Secondary | ICD-10-CM

## 2015-08-31 DIAGNOSIS — F329 Major depressive disorder, single episode, unspecified: Secondary | ICD-10-CM | POA: Insufficient documentation

## 2015-08-31 DIAGNOSIS — R5383 Other fatigue: Secondary | ICD-10-CM | POA: Insufficient documentation

## 2015-08-31 LAB — CBC
HCT: 37.6 % — ABNORMAL LOW (ref 40.0–52.0)
Hemoglobin: 12.6 g/dL — ABNORMAL LOW (ref 13.0–18.0)
MCH: 28.5 pg (ref 26.0–34.0)
MCHC: 33.4 g/dL (ref 32.0–36.0)
MCV: 85.4 fL (ref 80.0–100.0)
PLATELETS: 655 10*3/uL — AB (ref 150–440)
RBC: 4.4 MIL/uL (ref 4.40–5.90)
RDW: 14.9 % — AB (ref 11.5–14.5)
WBC: 9.8 10*3/uL (ref 3.8–10.6)

## 2015-08-31 LAB — IRON AND TIBC
Iron: 44 ug/dL — ABNORMAL LOW (ref 45–182)
Saturation Ratios: 17 % — ABNORMAL LOW (ref 17.9–39.5)
TIBC: 263 ug/dL (ref 250–450)
UIBC: 219 ug/dL

## 2015-08-31 LAB — FERRITIN: FERRITIN: 91 ng/mL (ref 24–336)

## 2015-08-31 NOTE — Progress Notes (Signed)
[place]  [phone] [phone]  ID: Jeff Patel OB: 1928-03-25  MR#: [id number]  WVP#:[id number]  Patient Care Team: Maryland Pink, MD as PCP - General (Family Medicine)  CHIEF COMPLAINT: Thrombocytosis.  INTERVAL HISTORY: Patient is an 82 year old male with a past medical history significant for myasthenia gravis. He has several chronic medical complaints related to this. He continues to have extreme weakness and fatigue. He is also having increased diarrhea and weight loss secondary to a new treatment. He has occasional dyspnea on exertion. He has no new neurologic complaints. He denies any recent fevers or illnesses. He denies any chest pain, cough, or hemoptysis. He denies any nausea, vomiting, constipation, or diarrhea. He has no urinary complaints. Patient offers no further specific complaints today.  REVIEW OF SYSTEMS:   Review of Systems  Constitutional: Positive for malaise/fatigue and weight loss. Negative for fever.  Respiratory: Positive for shortness of breath. Negative for cough and hemoptysis.   Cardiovascular: Negative.  Negative for chest pain.  Gastrointestinal: Positive for diarrhea. Negative for abdominal pain.  Genitourinary: Negative.   Musculoskeletal: Negative.   Neurological: Positive for weakness.  Psychiatric/Behavioral: Positive for depression.    As per HPI. Otherwise, a complete review of systems is negatve.  PAST MEDICAL HISTORY: Past Medical History:  Diagnosis Date  . Allergy   . Anxiety   . Arthritis    Osteoarthritis  . Bronchitis   . History of kidney cancer    Ablation surgery. Has a follow up 11/2014  . Hypertension    takes medication  . Kidney disease   . Macular degeneration   . Skin cancer     PAST SURGICAL HISTORY: Past Surgical History:  Procedure Laterality Date  . TRANSURETHRAL RESECTION OF PROSTATE      FAMILY HISTORY: Reviewed and unchanged. No reported history of  malignancy or chronic disease.    ADVANCED DIRECTIVES:    HEALTH MAINTENANCE: Social History  Substance Use Topics  . Smoking status: Former Smoker    Types: Cigarettes    Quit date: 02/01/1952  . Smokeless tobacco: Never Used  . Alcohol use No     Colonoscopy:  PAP:  Bone density:  Lipid panel:  Allergies  Allergen Reactions  . Adhesive [Tape]   . Ciprofloxacin   . Codeine   . Latex   . Levaquin [Levofloxacin In D5w]   . Shellfish Allergy     Current Outpatient Prescriptions  Medication Sig Dispense Refill  . ALPRAZolam (XANAX) 0.25 MG tablet Take 0.25 mg by mouth.    Marland Kitchen amLODipine (NORVASC) 2.5 MG tablet Take 2.5 mg by mouth daily.    Marland Kitchen aspirin 81 MG tablet Take 81 mg by mouth.    . furosemide (LASIX) 40 MG tablet Take 40 mg by mouth.    Marland Kitchen omeprazole (PRILOSEC) 20 MG capsule Take 20 mg by mouth daily.    Marland Kitchen POTASSIUM CHLORIDE PO Take by mouth.    . pyridostigmine (MESTINON) 60 MG tablet     . tamsulosin (FLOMAX) 0.4 MG CAPS capsule Take 0.4 mg by mouth.    . citalopram (CELEXA) 10 MG tablet Take 10 mg by mouth.    . fexofenadine (ALLEGRA) 180 MG tablet Take 180 mg by mouth.    . fluticasone (FLONASE) 50 MCG/ACT nasal spray 2 sprays by Each Nare route daily.    . Fluticasone-Salmeterol (ADVAIR DISKUS) 100-50 MCG/DOSE AEPB PRN     No current facility-administered medications for this visit.     OBJECTIVE: Vitals:  08/31/15 1116  BP: 129/77  Pulse: 80  Resp: 17  Temp: (!) 95.1 F (35.1 C)     Body mass index is 31.13 kg/m.    ECOG FS:1 - Symptomatic but completely ambulatory  General: Well-developed, well-nourished, no acute distress. Eyes: Pink conjunctiva, anicteric sclera. HEENT: Normocephalic, moist mucous membranes, clear oropharnyx. Lungs: Clear to auscultation bilaterally. Heart: Regular rate and rhythm. No rubs, murmurs, or gallops. Abdomen: Soft, nontender, nondistended. No organomegaly noted, normoactive bowel sounds. Musculoskeletal: No edema,  cyanosis, or clubbing. Neuro: Alert, answering all questions appropriately. Cranial nerves grossly intact. Skin: No rashes or petechiae noted. Psych: Normal affect. Lymphatics: No cervical, calvicular, axillary or inguinal LAD.   LAB RESULTS:  Lab Results  Component Value Date   K 3.6 09/05/2011    Lab Results  Component Value Date   WBC 8.1 06/18/2015   HGB 12.4 (L) 06/18/2015   HCT 37.5 (L) 06/18/2015   MCV 85.5 06/18/2015   PLT 603 (H) 06/18/2015     STUDIES: Ct Chest Wo Contrast  Result Date: 08/19/2015 CLINICAL DATA:  Recent diagnosis of myasthenia gravis. Short of breath. EXAM: CT CHEST WITHOUT CONTRAST TECHNIQUE: Multidetector CT imaging of the chest was performed following the standard protocol without IV contrast. COMPARISON:  None. FINDINGS: Cardiovascular: Normal heart size. No pericardial effusion. Coronary artery atherosclerosis in the left main, lad, circumflex and RCA. Normal caliber thoracic aorta. Thoracic aortic atherosclerosis. Mediastinum/Nodes: No mediastinal lymphadenopathy. No hilar lymphadenopathy. Normal esophagus. No mediastinal hematoma. No mediastinal mass. No findings to suggest a thymoma. Lungs/Pleura: No pleural effusion or pneumothorax. Mild fibrosis of the left lung base with mild bibasilar bronchiectasis. No focal consolidation. Upper Abdomen: Upper abdomen is unremarkable. Musculoskeletal: No acute osseous abnormality. No lytic or sclerotic osseous lesion. Anterior bridging osteophytes of the mid thoracic spine as can be seen with diffuse idiopathic skeletal hyperostosis. IMPRESSION: 1. No acute cardiopulmonary disease. 2. No mediastinal mass. Electronically Signed   By: Kathreen Devoid   On: 08/19/2015 14:03    ASSESSMENT: Thrombocytosis.  PLAN:    1. Thrombocytosis: Unclear etiology, but most likely reactive given his underlying chronic medical problems. Laboratory work from today including JAK-2 mutation to assess for essential thrombocytosis are  pending at time of dictation. No intervention is needed at this time. Patient does not require bone marrow biopsy. Return to clinic in 2 months with repeat laboratory work and further evaluation. If JAK-2 mutation is positive, patient will return to clinic sooner for discussion of results and consideration of treatment. 2. Myasthenia gravis: Continue treatment per neurology.  Patient expressed understanding and was in agreement with this plan. He also understands that He can call clinic at any time with any questions, concerns, or complaints.   Lloyd Huger, MD   08/31/2015 11:36 AM

## 2015-08-31 NOTE — Progress Notes (Signed)
Pt and wife report extreme fatigue and sleepiness, diareah from new medication, weight loss 10lbs over a few months, Sob on exertion.  Seeing Dr. Brigitte Pulse for myasthenia gravis.  Has had some depression Dr. Kary Kos.  Pt reports he feels like he stays cold all the time.

## 2015-09-12 LAB — JAK2 GENOTYPR

## 2015-10-08 ENCOUNTER — Inpatient Hospital Stay: Payer: Medicare Other

## 2015-10-08 ENCOUNTER — Inpatient Hospital Stay: Payer: Medicare Other | Admitting: Oncology

## 2015-10-16 ENCOUNTER — Other Ambulatory Visit: Payer: Self-pay | Admitting: *Deleted

## 2015-10-16 DIAGNOSIS — D473 Essential (hemorrhagic) thrombocythemia: Secondary | ICD-10-CM

## 2015-10-16 DIAGNOSIS — D75839 Thrombocytosis, unspecified: Secondary | ICD-10-CM

## 2015-10-18 NOTE — Progress Notes (Signed)
[facility]  [phone]) [phone] [phone]) [phone]  ID: Jeff Patel OB: 1929-01-15  MR#: [id number]  SN:[id number]  Patient Care Team: Maryland Pink, MD as PCP - General (Family Medicine)  CHIEF COMPLAINT: JAK-2 positive essential thrombocytosis.  INTERVAL HISTORY: Patient returns to clinic today for further evaluation and treatment planning. He continues to have extreme weakness and fatigue. He also has diarrhea and weight loss. He has a poor appetite. He has occasional dyspnea on exertion. He has no new neurologic complaints. He denies any recent fevers or illnesses. He denies any chest pain, cough, or hemoptysis. He denies any nausea, vomiting, or constipation. He has no urinary complaints. Patient offers no further specific complaints today.  REVIEW OF SYSTEMS:   Review of Systems  Constitutional: Positive for malaise/fatigue and weight loss. Negative for fever.  Respiratory: Positive for shortness of breath. Negative for cough and hemoptysis.   Cardiovascular: Negative.  Negative for chest pain.  Gastrointestinal: Positive for diarrhea. Negative for abdominal pain.  Genitourinary: Negative.   Musculoskeletal: Negative.   Neurological: Positive for weakness.  Psychiatric/Behavioral: Positive for depression.    As per HPI. Otherwise, a complete review of systems is negative.  PAST MEDICAL HISTORY: Past Medical History:  Diagnosis Date  . Allergy   . Anxiety   . Arthritis    Osteoarthritis  . Bronchitis   . History of kidney cancer    Ablation surgery. Has a follow up 11/2014  . Hypertension    takes medication  . Kidney disease   . Macular degeneration   . Skin cancer     PAST SURGICAL HISTORY: Past Surgical History:  Procedure Laterality Date  . TRANSURETHRAL RESECTION OF PROSTATE      FAMILY HISTORY: Reviewed and unchanged. No reported history of malignancy or chronic disease.    ADVANCED DIRECTIVES:    HEALTH  MAINTENANCE: Social History  Substance Use Topics  . Smoking status: Former Smoker    Types: Cigarettes    Quit date: 02/01/1952  . Smokeless tobacco: Never Used  . Alcohol use No     Colonoscopy:  PAP:  Bone density:  Lipid panel:  Allergies  Allergen Reactions  . Other Other (See Comments) and Anaphylaxis    BEE STINGS BEE STINGS  . Adhesive [Tape]   . Ciprofloxacin   . Codeine   . Latex   . Levaquin [Levofloxacin In D5w]   . Quinolones Other (See Comments)  . Shellfish Allergy     Current Outpatient Prescriptions  Medication Sig Dispense Refill  . ALPRAZolam (XANAX) 0.25 MG tablet Take 0.25 mg by mouth.    Marland Kitchen amLODipine (NORVASC) 2.5 MG tablet Take 2.5 mg by mouth daily.    Marland Kitchen aspirin 81 MG tablet Take 81 mg by mouth.    . citalopram (CELEXA) 10 MG tablet Take 10 mg by mouth.    . fexofenadine (ALLEGRA) 180 MG tablet Take 180 mg by mouth.    . fluticasone (FLONASE) 50 MCG/ACT nasal spray 2 sprays by Each Nare route daily.    . Fluticasone-Salmeterol (ADVAIR DISKUS) 100-50 MCG/DOSE AEPB PRN    . furosemide (LASIX) 40 MG tablet Take 40 mg by mouth.    Marland Kitchen omeprazole (PRILOSEC) 20 MG capsule Take 20 mg by mouth daily.    Marland Kitchen POTASSIUM CHLORIDE PO Take by mouth.    . tamsulosin (FLOMAX) 0.4 MG CAPS capsule Take 0.4 mg by mouth.     No current facility-administered medications for this visit.     OBJECTIVE: Vitals:  10/19/15 1514  BP: (!) 150/87  Pulse: 76  Resp: 18  Temp: 97.7 F (36.5 C)     Body mass index is 30.23 kg/m.    ECOG FS:1 - Symptomatic but completely ambulatory  General: Well-developed, well-nourished, no acute distress. Eyes: Pink conjunctiva, anicteric sclera. Lungs: Clear to auscultation bilaterally. Heart: Regular rate and rhythm. No rubs, murmurs, or gallops. Abdomen: Soft, nontender, nondistended. No organomegaly noted, normoactive bowel sounds. Musculoskeletal: No edema, cyanosis, or clubbing. Neuro: Alert, answering all questions  appropriately. Cranial nerves grossly intact. Skin: No rashes or petechiae noted. Psych: Normal affect.   LAB RESULTS:  Lab Results  Component Value Date   K 3.6 09/05/2011    Lab Results  Component Value Date   WBC 10.1 10/19/2015   NEUTROABS 7.0 (H) 10/19/2015   HGB 12.9 (L) 10/19/2015   HCT 38.4 (L) 10/19/2015   MCV 85.6 10/19/2015   PLT 612 (H) 10/19/2015   Lab Results  Component Value Date   IRON 44 (L) 08/31/2015   TIBC 263 08/31/2015   IRONPCTSAT 17 (L) 08/31/2015   Lab Results  Component Value Date   FERRITIN 91 08/31/2015     STUDIES: No results found.  ASSESSMENT: JAK-2 positive essential thrombocytosis. PLAN:    1.JAK-2 positive essential thrombocytosis:  Patient noted to be positive for the JAK-2 mutation which puts him at increased risk for thrombotic complications. The remainder of his laboratory work, except for a mild iron deficiency anemia, is either negative or within normal limits. Patient will require Hydrea 500 mg daily, but will hold off on prescribing until we can be sure if there will be no interaction with his myasthenia gravis diagnosis or treatment. Plan to initiate Hydrea in the next several days. Return to clinic in 1 month to assess his toleration of treatment and repeat laboratory work.  2. Myasthenia gravis: Continue treatment per neurology. Patient reports he has an appointment at [facility] in November 2017. 3. Iron deficiency anemia: Mild, monitor.  Approximately 30 minutes was spent in discussion of which greater than 50% was consultation.  Patient expressed understanding and was in agreement with this plan. He also understands that He can call clinic at any time with any questions, concerns, or complaints.   Lloyd Huger, MD   10/19/2015 3:58 PM

## 2015-10-19 ENCOUNTER — Inpatient Hospital Stay: Payer: Medicare Other

## 2015-10-19 ENCOUNTER — Inpatient Hospital Stay: Payer: Medicare Other | Attending: Oncology | Admitting: Oncology

## 2015-10-19 VITALS — BP 150/87 | HR 76 | Temp 97.7°F | Resp 18 | Wt 216.7 lb

## 2015-10-19 DIAGNOSIS — D473 Essential (hemorrhagic) thrombocythemia: Secondary | ICD-10-CM | POA: Diagnosis not present

## 2015-10-19 DIAGNOSIS — R531 Weakness: Secondary | ICD-10-CM

## 2015-10-19 DIAGNOSIS — I10 Essential (primary) hypertension: Secondary | ICD-10-CM | POA: Diagnosis not present

## 2015-10-19 DIAGNOSIS — R0602 Shortness of breath: Secondary | ICD-10-CM | POA: Diagnosis not present

## 2015-10-19 DIAGNOSIS — M1990 Unspecified osteoarthritis, unspecified site: Secondary | ICD-10-CM

## 2015-10-19 DIAGNOSIS — R630 Anorexia: Secondary | ICD-10-CM | POA: Diagnosis not present

## 2015-10-19 DIAGNOSIS — Z7982 Long term (current) use of aspirin: Secondary | ICD-10-CM

## 2015-10-19 DIAGNOSIS — R5381 Other malaise: Secondary | ICD-10-CM

## 2015-10-19 DIAGNOSIS — Z85828 Personal history of other malignant neoplasm of skin: Secondary | ICD-10-CM | POA: Diagnosis not present

## 2015-10-19 DIAGNOSIS — G7000 Myasthenia gravis without (acute) exacerbation: Secondary | ICD-10-CM | POA: Diagnosis not present

## 2015-10-19 DIAGNOSIS — D509 Iron deficiency anemia, unspecified: Secondary | ICD-10-CM

## 2015-10-19 DIAGNOSIS — D75839 Thrombocytosis, unspecified: Secondary | ICD-10-CM

## 2015-10-19 DIAGNOSIS — Z87891 Personal history of nicotine dependence: Secondary | ICD-10-CM | POA: Diagnosis not present

## 2015-10-19 DIAGNOSIS — R5383 Other fatigue: Secondary | ICD-10-CM

## 2015-10-19 DIAGNOSIS — F329 Major depressive disorder, single episode, unspecified: Secondary | ICD-10-CM | POA: Diagnosis not present

## 2015-10-19 DIAGNOSIS — R634 Abnormal weight loss: Secondary | ICD-10-CM | POA: Diagnosis not present

## 2015-10-19 DIAGNOSIS — Z79899 Other long term (current) drug therapy: Secondary | ICD-10-CM

## 2015-10-19 DIAGNOSIS — R197 Diarrhea, unspecified: Secondary | ICD-10-CM

## 2015-10-19 LAB — CBC WITH DIFFERENTIAL/PLATELET
BASOS ABS: 0.1 10*3/uL (ref 0–0.1)
Basophils Relative: 1 %
EOS ABS: 0.4 10*3/uL (ref 0–0.7)
EOS PCT: 4 %
HCT: 38.4 % — ABNORMAL LOW (ref 40.0–52.0)
HEMOGLOBIN: 12.9 g/dL — AB (ref 13.0–18.0)
LYMPHS ABS: 1.9 10*3/uL (ref 1.0–3.6)
Lymphocytes Relative: 19 %
MCH: 28.8 pg (ref 26.0–34.0)
MCHC: 33.7 g/dL (ref 32.0–36.0)
MCV: 85.6 fL (ref 80.0–100.0)
Monocytes Absolute: 0.6 10*3/uL (ref 0.2–1.0)
Monocytes Relative: 6 %
NEUTROS PCT: 70 %
Neutro Abs: 7 10*3/uL — ABNORMAL HIGH (ref 1.4–6.5)
PLATELETS: 612 10*3/uL — AB (ref 150–440)
RBC: 4.49 MIL/uL (ref 4.40–5.90)
RDW: 15.1 % — ABNORMAL HIGH (ref 11.5–14.5)
WBC: 10.1 10*3/uL (ref 3.8–10.6)

## 2015-11-04 ENCOUNTER — Ambulatory Visit: Payer: Medicare Other | Admitting: Oncology

## 2015-11-04 ENCOUNTER — Other Ambulatory Visit: Payer: Medicare Other

## 2015-11-23 ENCOUNTER — Inpatient Hospital Stay: Payer: Medicare Other

## 2015-11-23 ENCOUNTER — Inpatient Hospital Stay: Payer: Medicare Other | Admitting: Oncology

## 2015-11-23 NOTE — Progress Notes (Deleted)
[place]  Telephone:(336) [phone] [phone]  ID: Jeff Patel OB: Dec 02, 1928  MR#: [id number]  [id number]  Patient Care Team: Maryland Pink, MD as PCP - General (Family Medicine)  CHIEF COMPLAINT: JAK-2 positive essential thrombocytosis.  INTERVAL HISTORY: Patient returns to clinic today for further evaluation and treatment planning. He continues to have extreme weakness and fatigue. He also has diarrhea and weight loss. He has a poor appetite. He has occasional dyspnea on exertion. He has no new neurologic complaints. He denies any recent fevers or illnesses. He denies any chest pain, cough, or hemoptysis. He denies any nausea, vomiting, or constipation. He has no urinary complaints. Patient offers no further specific complaints today.  REVIEW OF SYSTEMS:   Review of Systems  Constitutional: Positive for malaise/fatigue and weight loss. Negative for fever.  Respiratory: Positive for shortness of breath. Negative for cough and hemoptysis.   Cardiovascular: Negative.  Negative for chest pain.  Gastrointestinal: Positive for diarrhea. Negative for abdominal pain.  Genitourinary: Negative.   Musculoskeletal: Negative.   Neurological: Positive for weakness.  Psychiatric/Behavioral: Positive for depression.    As per HPI. Otherwise, a complete review of systems is negative.  PAST MEDICAL HISTORY: Past Medical History:  Diagnosis Date  . Allergy   . Anxiety   . Arthritis    Osteoarthritis  . Bronchitis   . History of kidney cancer    Ablation surgery. Has a follow up 11/2014  . Hypertension    takes medication  . Kidney disease   . Macular degeneration   . Skin cancer     PAST SURGICAL HISTORY: Past Surgical History:  Procedure Laterality Date  . TRANSURETHRAL RESECTION OF PROSTATE      FAMILY HISTORY: Reviewed and unchanged. No reported history of malignancy or chronic disease.    ADVANCED DIRECTIVES:    HEALTH  MAINTENANCE: Social History  Substance Use Topics  . Smoking status: Former Smoker    Types: Cigarettes    Quit date: 02/01/1952  . Smokeless tobacco: Never Used  . Alcohol use No     Colonoscopy:  PAP:  Bone density:  Lipid panel:  Allergies  Allergen Reactions  . Other Other (See Comments) and Anaphylaxis    BEE STINGS BEE STINGS  . Adhesive [Tape]   . Ciprofloxacin   . Codeine   . Latex   . Levaquin [Levofloxacin In D5w]   . Quinolones Other (See Comments)  . Shellfish Allergy     Current Outpatient Prescriptions  Medication Sig Dispense Refill  . ALPRAZolam (XANAX) 0.25 MG tablet Take 0.25 mg by mouth.    Marland Kitchen amLODipine (NORVASC) 2.5 MG tablet Take 2.5 mg by mouth daily.    Marland Kitchen aspirin 81 MG tablet Take 81 mg by mouth.    . citalopram (CELEXA) 10 MG tablet Take 10 mg by mouth.    . fexofenadine (ALLEGRA) 180 MG tablet Take 180 mg by mouth.    . fluticasone (FLONASE) 50 MCG/ACT nasal spray 2 sprays by Each Nare route daily.    . Fluticasone-Salmeterol (ADVAIR DISKUS) 100-50 MCG/DOSE AEPB PRN    . furosemide (LASIX) 40 MG tablet Take 40 mg by mouth.    Marland Kitchen omeprazole (PRILOSEC) 20 MG capsule Take 20 mg by mouth daily.    Marland Kitchen POTASSIUM CHLORIDE PO Take by mouth.    . tamsulosin (FLOMAX) 0.4 MG CAPS capsule Take 0.4 mg by mouth.     No current facility-administered medications for this visit.     OBJECTIVE: There were  no vitals filed for this visit.   There is no height or weight on file to calculate BMI.    ECOG FS:1 - Symptomatic but completely ambulatory  General: Well-developed, well-nourished, no acute distress. Eyes: Pink conjunctiva, anicteric sclera. Lungs: Clear to auscultation bilaterally. Heart: Regular rate and rhythm. No rubs, murmurs, or gallops. Abdomen: Soft, nontender, nondistended. No organomegaly noted, normoactive bowel sounds. Musculoskeletal: No edema, cyanosis, or clubbing. Neuro: Alert, answering all questions appropriately. Cranial nerves grossly  intact. Skin: No rashes or petechiae noted. Psych: Normal affect.   LAB RESULTS:  Lab Results  Component Value Date   K 3.6 09/05/2011    Lab Results  Component Value Date   WBC 10.1 10/19/2015   NEUTROABS 7.0 (H) 10/19/2015   HGB 12.9 (L) 10/19/2015   HCT 38.4 (L) 10/19/2015   MCV 85.6 10/19/2015   PLT 612 (H) 10/19/2015   Lab Results  Component Value Date   IRON 44 (L) 08/31/2015   TIBC 263 08/31/2015   IRONPCTSAT 17 (L) 08/31/2015   Lab Results  Component Value Date   FERRITIN 91 08/31/2015     STUDIES: No results found.  ASSESSMENT: JAK-2 positive essential thrombocytosis. PLAN:    1.JAK-2 positive essential thrombocytosis:  Patient noted to be positive for the JAK-2 mutation which puts him at increased risk for thrombotic complications. The remainder of his laboratory work, except for a mild iron deficiency anemia, is either negative or within normal limits. Patient will require Hydrea 500 mg daily, but will hold off on prescribing until we can be sure if there will be no interaction with his myasthenia gravis diagnosis or treatment. Plan to initiate Hydrea in the next several days. Return to clinic in 1 month to assess his toleration of treatment and repeat laboratory work.  2. Myasthenia gravis: Continue treatment per neurology. Patient reports he has an appointment at [facility] in November 2017. 3. Iron deficiency anemia: Mild, monitor.  Approximately 30 minutes was spent in discussion of which greater than 50% was consultation.  Patient expressed understanding and was in agreement with this plan. He also understands that He can call clinic at any time with any questions, concerns, or complaints.   Lloyd Huger, MD   11/23/2015 8:23 AM

## 2015-12-01 ENCOUNTER — Inpatient Hospital Stay: Payer: Medicare Other

## 2015-12-01 ENCOUNTER — Inpatient Hospital Stay: Payer: Medicare Other | Attending: Oncology | Admitting: Oncology

## 2015-12-01 VITALS — BP 138/83 | HR 94 | Temp 98.2°F | Resp 18 | Wt 216.6 lb

## 2015-12-01 DIAGNOSIS — G7000 Myasthenia gravis without (acute) exacerbation: Secondary | ICD-10-CM | POA: Diagnosis not present

## 2015-12-01 DIAGNOSIS — R5381 Other malaise: Secondary | ICD-10-CM

## 2015-12-01 DIAGNOSIS — R630 Anorexia: Secondary | ICD-10-CM | POA: Diagnosis not present

## 2015-12-01 DIAGNOSIS — D473 Essential (hemorrhagic) thrombocythemia: Secondary | ICD-10-CM | POA: Diagnosis present

## 2015-12-01 DIAGNOSIS — Z79899 Other long term (current) drug therapy: Secondary | ICD-10-CM | POA: Diagnosis not present

## 2015-12-01 DIAGNOSIS — R0609 Other forms of dyspnea: Secondary | ICD-10-CM | POA: Insufficient documentation

## 2015-12-01 DIAGNOSIS — R531 Weakness: Secondary | ICD-10-CM | POA: Insufficient documentation

## 2015-12-01 DIAGNOSIS — Z85828 Personal history of other malignant neoplasm of skin: Secondary | ICD-10-CM | POA: Insufficient documentation

## 2015-12-01 DIAGNOSIS — Z85528 Personal history of other malignant neoplasm of kidney: Secondary | ICD-10-CM | POA: Diagnosis not present

## 2015-12-01 DIAGNOSIS — M1990 Unspecified osteoarthritis, unspecified site: Secondary | ICD-10-CM | POA: Insufficient documentation

## 2015-12-01 DIAGNOSIS — R634 Abnormal weight loss: Secondary | ICD-10-CM | POA: Diagnosis not present

## 2015-12-01 DIAGNOSIS — R5383 Other fatigue: Secondary | ICD-10-CM | POA: Diagnosis not present

## 2015-12-01 DIAGNOSIS — F329 Major depressive disorder, single episode, unspecified: Secondary | ICD-10-CM | POA: Insufficient documentation

## 2015-12-01 DIAGNOSIS — Z87891 Personal history of nicotine dependence: Secondary | ICD-10-CM

## 2015-12-01 LAB — CBC WITH DIFFERENTIAL/PLATELET
Basophils Absolute: 0 10*3/uL (ref 0–0.1)
Basophils Relative: 0 %
EOS ABS: 0.6 10*3/uL (ref 0–0.7)
EOS PCT: 6 %
HCT: 37.7 % — ABNORMAL LOW (ref 40.0–52.0)
Hemoglobin: 12.8 g/dL — ABNORMAL LOW (ref 13.0–18.0)
LYMPHS ABS: 2 10*3/uL (ref 1.0–3.6)
Lymphocytes Relative: 20 %
MCH: 28.6 pg (ref 26.0–34.0)
MCHC: 34 g/dL (ref 32.0–36.0)
MCV: 84.2 fL (ref 80.0–100.0)
MONO ABS: 0.7 10*3/uL (ref 0.2–1.0)
MONOS PCT: 6 %
Neutro Abs: 7 10*3/uL — ABNORMAL HIGH (ref 1.4–6.5)
Neutrophils Relative %: 68 %
PLATELETS: 655 10*3/uL — AB (ref 150–440)
RBC: 4.47 MIL/uL (ref 4.40–5.90)
RDW: 15 % — AB (ref 11.5–14.5)
WBC: 10.3 10*3/uL (ref 3.8–10.6)

## 2015-12-01 NOTE — Progress Notes (Signed)
[place]  [phone]) [phone] [phone]) [phone]  ID: Jeff Patel OB: 1928/03/10  MR#: [id number]  [id number]  Patient Care Team: Maryland Pink, MD as PCP - General (Family Medicine)  CHIEF COMPLAINT: JAK-2 positive essential thrombocytosis.  INTERVAL HISTORY: Patient returns to clinic today for further evaluation and treatment planning. He continues to have extreme weakness and fatigue secondary to his diagnosis of myasthenia gravis. He continues to have a poor appetite and weight loss. He has occasional dyspnea on exertion. He has no new neurologic complaints. He denies any recent fevers or illnesses. He denies any chest pain, cough, or hemoptysis. He denies any nausea, vomiting, or constipation. He has no urinary complaints. Patient offers no further specific complaints today.  REVIEW OF SYSTEMS:   Review of Systems  Constitutional: Positive for malaise/fatigue and weight loss. Negative for fever.  Respiratory: Positive for shortness of breath. Negative for cough and hemoptysis.   Cardiovascular: Negative.  Negative for chest pain.  Gastrointestinal: Negative for abdominal pain and diarrhea.  Genitourinary: Negative.   Musculoskeletal: Negative.   Neurological: Positive for weakness.  Endo/Heme/Allergies: Does not bruise/bleed easily.  Psychiatric/Behavioral: Positive for depression. The patient is not nervous/anxious.    As per HPI. Otherwise, a complete review of systems is negative.   PAST MEDICAL HISTORY: Past Medical History:  Diagnosis Date  . Allergy   . Anxiety   . Arthritis    Osteoarthritis  . Bronchitis   . History of kidney cancer    Ablation surgery. Has a follow up 11/2014  . Hypertension    takes medication  . Kidney disease   . Macular degeneration   . Skin cancer     PAST SURGICAL HISTORY: Past Surgical History:  Procedure Laterality Date  . TRANSURETHRAL RESECTION OF PROSTATE      FAMILY HISTORY: Reviewed and  unchanged. No reported history of malignancy or chronic disease.    ADVANCED DIRECTIVES:    HEALTH MAINTENANCE: Social History  Substance Use Topics  . Smoking status: Former Smoker    Types: Cigarettes    Quit date: 02/01/1952  . Smokeless tobacco: Never Used  . Alcohol use No     Colonoscopy:  PAP:  Bone density:  Lipid panel:  Allergies  Allergen Reactions  . Other Other (See Comments) and Anaphylaxis    BEE STINGS BEE STINGS  . Adhesive [Tape]   . Ciprofloxacin   . Codeine   . Latex   . Levaquin [Levofloxacin In D5w]   . Quinolones Other (See Comments)  . Shellfish Allergy     Current Outpatient Prescriptions  Medication Sig Dispense Refill  . ALPRAZolam (XANAX) 0.25 MG tablet Take 0.25 mg by mouth.    Marland Kitchen amLODipine (NORVASC) 2.5 MG tablet Take 2.5 mg by mouth daily.    Marland Kitchen aspirin 81 MG tablet Take 81 mg by mouth.    . citalopram (CELEXA) 10 MG tablet Take 10 mg by mouth.    . fexofenadine (ALLEGRA) 180 MG tablet Take 180 mg by mouth.    . fluticasone (FLONASE) 50 MCG/ACT nasal spray 2 sprays by Each Nare route daily.    . Fluticasone-Salmeterol (ADVAIR DISKUS) 100-50 MCG/DOSE AEPB PRN    . furosemide (LASIX) 40 MG tablet Take 40 mg by mouth.    Marland Kitchen omeprazole (PRILOSEC) 20 MG capsule Take 20 mg by mouth daily.    Marland Kitchen POTASSIUM CHLORIDE PO Take by mouth.    . tamsulosin (FLOMAX) 0.4 MG CAPS capsule Take 0.4 mg by mouth.  No current facility-administered medications for this visit.     OBJECTIVE: Vitals:   12/01/15 1549  BP: 138/83  Pulse: 94  Resp: 18  Temp: 98.2 F (36.8 C)     Body mass index is 30.21 kg/m.    ECOG FS:1 - Symptomatic but completely ambulatory  General: Well-developed, well-nourished, no acute distress. Eyes: Pink conjunctiva, anicteric sclera. Lungs: Clear to auscultation bilaterally. Heart: Regular rate and rhythm. No rubs, murmurs, or gallops. Abdomen: Soft, nontender, nondistended. No organomegaly noted, normoactive bowel  sounds. Musculoskeletal: No edema, cyanosis, or clubbing. Neuro: Alert, answering all questions appropriately. Cranial nerves grossly intact. Skin: No rashes or petechiae noted. Psych: Normal affect.   LAB RESULTS:  Lab Results  Component Value Date   K 3.6 09/05/2011    Lab Results  Component Value Date   WBC 10.3 12/01/2015   NEUTROABS 7.0 (H) 12/01/2015   HGB 12.8 (L) 12/01/2015   HCT 37.7 (L) 12/01/2015   MCV 84.2 12/01/2015   PLT 655 (H) 12/01/2015   Lab Results  Component Value Date   IRON 44 (L) 08/31/2015   TIBC 263 08/31/2015   IRONPCTSAT 17 (L) 08/31/2015   Lab Results  Component Value Date   FERRITIN 91 08/31/2015     STUDIES: No results found.  ASSESSMENT: JAK-2 positive essential thrombocytosis. PLAN:    1.JAK-2 positive essential thrombocytosis:  Patient's platelet count remains elevated, but essentially unchanged.  Patient noted to be positive for the JAK-2 mutation which puts him at increased risk for thrombotic complications. The remainder of his laboratory work, except for a mild iron deficiency anemia, is either negative or within normal limits. Patient will require Hydrea 500 mg daily, but will hold off on prescribing until he has a full evaluation for his myasthenia gravis. Patient has appointment at [facility] on December 22, 2015. Patient will follow-up in early December for repeat laboratory work and further evaluation.  2. Myasthenia gravis: Continue treatment per neurology. Patient reports he has an appointment at [facility] on December 22, 2015 at [facility] with-   Jeff Patel, Oren Binet, MD  [address]  [place], [place] [address]  [phone]  [phone] (Fax)   3. Iron deficiency anemia: Mild, monitor.   Approximately 30 minutes was spent in discussion of which greater than 50% was consultation.  Patient expressed understanding and was in agreement with this plan. He also understands that He can call  clinic at any time with any questions, concerns, or complaints.   Lloyd Huger, MD   12/01/2015 10:38 PM

## 2015-12-01 NOTE — Progress Notes (Signed)
States is feeling very weak and tired today. Decreased energy levels the past few days.

## 2015-12-10 ENCOUNTER — Other Ambulatory Visit: Payer: Medicare Other

## 2015-12-10 ENCOUNTER — Ambulatory Visit: Payer: Medicare Other | Admitting: Oncology

## 2016-01-04 ENCOUNTER — Other Ambulatory Visit: Payer: Medicare Other

## 2016-01-04 ENCOUNTER — Ambulatory Visit: Payer: Medicare Other | Admitting: Oncology

## 2016-01-04 ENCOUNTER — Inpatient Hospital Stay (HOSPITAL_BASED_OUTPATIENT_CLINIC_OR_DEPARTMENT_OTHER): Payer: Medicare Other | Admitting: Oncology

## 2016-01-04 ENCOUNTER — Inpatient Hospital Stay: Payer: Medicare Other | Attending: Oncology

## 2016-01-04 ENCOUNTER — Other Ambulatory Visit: Payer: Self-pay

## 2016-01-04 VITALS — BP 123/84 | HR 71 | Temp 98.3°F | Resp 18 | Wt 220.1 lb

## 2016-01-04 DIAGNOSIS — R531 Weakness: Secondary | ICD-10-CM | POA: Diagnosis not present

## 2016-01-04 DIAGNOSIS — Z79899 Other long term (current) drug therapy: Secondary | ICD-10-CM

## 2016-01-04 DIAGNOSIS — I10 Essential (primary) hypertension: Secondary | ICD-10-CM

## 2016-01-04 DIAGNOSIS — F329 Major depressive disorder, single episode, unspecified: Secondary | ICD-10-CM

## 2016-01-04 DIAGNOSIS — H3530 Unspecified macular degeneration: Secondary | ICD-10-CM

## 2016-01-04 DIAGNOSIS — R5383 Other fatigue: Secondary | ICD-10-CM

## 2016-01-04 DIAGNOSIS — Z85528 Personal history of other malignant neoplasm of kidney: Secondary | ICD-10-CM

## 2016-01-04 DIAGNOSIS — Z7982 Long term (current) use of aspirin: Secondary | ICD-10-CM

## 2016-01-04 DIAGNOSIS — Z85828 Personal history of other malignant neoplasm of skin: Secondary | ICD-10-CM | POA: Diagnosis not present

## 2016-01-04 DIAGNOSIS — G7000 Myasthenia gravis without (acute) exacerbation: Secondary | ICD-10-CM | POA: Insufficient documentation

## 2016-01-04 DIAGNOSIS — R0609 Other forms of dyspnea: Secondary | ICD-10-CM | POA: Insufficient documentation

## 2016-01-04 DIAGNOSIS — D473 Essential (hemorrhagic) thrombocythemia: Secondary | ICD-10-CM | POA: Insufficient documentation

## 2016-01-04 DIAGNOSIS — R630 Anorexia: Secondary | ICD-10-CM | POA: Diagnosis not present

## 2016-01-04 DIAGNOSIS — M1990 Unspecified osteoarthritis, unspecified site: Secondary | ICD-10-CM | POA: Diagnosis not present

## 2016-01-04 DIAGNOSIS — D509 Iron deficiency anemia, unspecified: Secondary | ICD-10-CM | POA: Insufficient documentation

## 2016-01-04 DIAGNOSIS — R634 Abnormal weight loss: Secondary | ICD-10-CM

## 2016-01-04 DIAGNOSIS — R5381 Other malaise: Secondary | ICD-10-CM | POA: Insufficient documentation

## 2016-01-04 DIAGNOSIS — Z87891 Personal history of nicotine dependence: Secondary | ICD-10-CM | POA: Diagnosis not present

## 2016-01-04 LAB — CBC WITH DIFFERENTIAL/PLATELET
BASOS ABS: 0.1 10*3/uL (ref 0–0.1)
BASOS PCT: 2 %
Eosinophils Absolute: 0.8 10*3/uL — ABNORMAL HIGH (ref 0–0.7)
Eosinophils Relative: 10 %
HEMATOCRIT: 36.8 % — AB (ref 40.0–52.0)
HEMOGLOBIN: 12.4 g/dL — AB (ref 13.0–18.0)
LYMPHS PCT: 17 %
Lymphs Abs: 1.4 10*3/uL (ref 1.0–3.6)
MCH: 28.5 pg (ref 26.0–34.0)
MCHC: 33.6 g/dL (ref 32.0–36.0)
MCV: 84.9 fL (ref 80.0–100.0)
Monocytes Absolute: 0.5 10*3/uL (ref 0.2–1.0)
Monocytes Relative: 6 %
NEUTROS ABS: 5.5 10*3/uL (ref 1.4–6.5)
NEUTROS PCT: 65 %
Platelets: 640 10*3/uL — ABNORMAL HIGH (ref 150–440)
RBC: 4.34 MIL/uL — AB (ref 4.40–5.90)
RDW: 15 % — ABNORMAL HIGH (ref 11.5–14.5)
WBC: 8.5 10*3/uL (ref 3.8–10.6)

## 2016-01-04 NOTE — Progress Notes (Signed)
[place]  [phone]) [phone] Fax:(336) [phone]  ID: Carloyn Jaeger OB: [birth year]  MR#: [id number]  [id number]  Patient Care Team: Maryland Pink, MD as PCP - General (Family Medicine) Ranee Gosselin, MD as Consulting Physician (Neurology)  CHIEF COMPLAINT: JAK-2 positive essential thrombocytosis.  INTERVAL HISTORY: Patient returns to clinic today for further evaluation and laboratory work. He continues to have extreme weakness and fatigue secondary to his diagnosis of myasthenia gravis. He continues to have a poor appetite and weight loss. He has occasional dyspnea on exertion. He has no new neurologic complaints. He denies any recent fevers or illnesses. He denies any chest pain, cough, or hemoptysis. He denies any nausea, vomiting, or constipation. He has no urinary complaints. Patient offers no further specific complaints today.  REVIEW OF SYSTEMS:   Review of Systems  Constitutional: Positive for malaise/fatigue. Negative for fever and weight loss.  Respiratory: Positive for shortness of breath. Negative for cough and hemoptysis.   Cardiovascular: Negative.  Negative for chest pain.  Gastrointestinal: Negative for abdominal pain and diarrhea.  Genitourinary: Negative.   Musculoskeletal: Negative.   Neurological: Positive for focal weakness and weakness.  Endo/Heme/Allergies: Does not bruise/bleed easily.  Psychiatric/Behavioral: Positive for depression. The patient is not nervous/anxious.    As per HPI. Otherwise, a complete review of systems is negative.   PAST MEDICAL HISTORY: Past Medical History:  Diagnosis Date  . Allergy   . Anxiety   . Arthritis    Osteoarthritis  . Bronchitis   . History of kidney cancer    Ablation surgery. Has a follow up 11/2014  . Hypertension    takes medication  . Kidney disease   . Macular degeneration   . Skin cancer     PAST SURGICAL HISTORY: Past Surgical History:  Procedure Laterality Date  .  TRANSURETHRAL RESECTION OF PROSTATE      FAMILY HISTORY: Reviewed and unchanged. No reported history of malignancy or chronic disease.    ADVANCED DIRECTIVES:    HEALTH MAINTENANCE: Social History  Substance Use Topics  . Smoking status: Former Smoker    Types: Cigarettes    Quit date: 02/01/1952  . Smokeless tobacco: Never Used  . Alcohol use No     Colonoscopy:  PAP:  Bone density:  Lipid panel:  Allergies  Allergen Reactions  . Other Other (See Comments) and Anaphylaxis    BEE STINGS BEE STINGS  . Adhesive [Tape]   . Ciprofloxacin   . Codeine   . Latex   . Levaquin [Levofloxacin In D5w]   . Quinolones Other (See Comments)  . Shellfish Allergy     Current Outpatient Prescriptions  Medication Sig Dispense Refill  . ALPRAZolam (XANAX) 0.25 MG tablet Take 0.25 mg by mouth at bedtime as needed.     Marland Kitchen amLODipine (NORVASC) 2.5 MG tablet Take 2.5 mg by mouth daily.    Marland Kitchen aspirin 81 MG tablet Take 81 mg by mouth daily.     . citalopram (CELEXA) 10 MG tablet Take 10 mg by mouth daily.     . fexofenadine (ALLEGRA) 180 MG tablet Take 180 mg by mouth daily.     . fluticasone (FLONASE) 50 MCG/ACT nasal spray 2 sprays by Each Nare route daily.    . Fluticasone-Salmeterol (ADVAIR DISKUS) 100-50 MCG/DOSE AEPB PRN    . furosemide (LASIX) 40 MG tablet Take 40 mg by mouth daily.     Marland Kitchen omeprazole (PRILOSEC) 20 MG capsule Take 20 mg by mouth daily.    Marland Kitchen  POTASSIUM CHLORIDE PO Take 1 tablet by mouth daily.     . tamsulosin (FLOMAX) 0.4 MG CAPS capsule Take 0.4 mg by mouth daily.      No current facility-administered medications for this visit.     OBJECTIVE: Vitals:   01/04/16 1050  BP: 123/84  Pulse: 71  Resp: 18  Temp: 98.3 F (36.8 C)     Body mass index is 30.7 kg/m.    ECOG FS:1 - Symptomatic but completely ambulatory  General: Well-developed, well-nourished, no acute distress. Eyes: Pink conjunctiva, anicteric sclera. Lungs: Clear to auscultation bilaterally. Heart:  Regular rate and rhythm. No rubs, murmurs, or gallops. Abdomen: Soft, nontender, nondistended. No organomegaly noted, normoactive bowel sounds. Musculoskeletal: No edema, cyanosis, or clubbing. Neuro: Alert, answering all questions appropriately. Cranial nerves grossly intact. Skin: No rashes or petechiae noted. Psych: Normal affect.   LAB RESULTS:  Lab Results  Component Value Date   K 3.6 09/05/2011    Lab Results  Component Value Date   WBC 8.5 01/04/2016   NEUTROABS 5.5 01/04/2016   HGB 12.4 (L) 01/04/2016   HCT 36.8 (L) 01/04/2016   MCV 84.9 01/04/2016   PLT 640 (H) 01/04/2016   Lab Results  Component Value Date   IRON 44 (L) 08/31/2015   TIBC 263 08/31/2015   IRONPCTSAT 17 (L) 08/31/2015   Lab Results  Component Value Date   FERRITIN 91 08/31/2015     STUDIES: No results found.  ASSESSMENT: JAK-2 positive essential thrombocytosis.  PLAN:    1.JAK-2 positive essential thrombocytosis:  Patient's platelet count remains elevated, but essentially unchanged.  Patient noted to be positive for the JAK-2 mutation which puts him at increased risk for thrombotic complications. The remainder of his laboratory work, except for a mild iron deficiency anemia, is either negative or within normal limits. Patient will require Hydrea 500 mg daily, but will hold off on prescribing at this point. If patient's platelet count trends up and approaches 1000, will consider Hydrea at that point.  Patient reports he is having plasmapheresis as well as being placed on prednisone to help treat his myasthenia gravis. Return to clinic in 3 months with repeat laboratory work and further evaluation. 2. Myasthenia gravis: Continue evaluation and treatment per [facility] neurology. Prednisone and plasmapheresis as above. 3. Iron deficiency anemia: Mild, monitor.  Patient's neurologist:  Servando Salina, MD  [address]  [place], [place] [address]  [phone]  [phone] (Fax)     Approximately 30 minutes was spent in discussion of which greater than 50% was consultation.  Patient expressed understanding and was in agreement with this plan. He also understands that He can call clinic at any time with any questions, concerns, or complaints.   Lloyd Huger, MD   01/04/2016 11:01 AM

## 2016-01-04 NOTE — Progress Notes (Signed)
Offers no complaints  

## 2016-04-04 ENCOUNTER — Other Ambulatory Visit: Payer: Medicare Other

## 2016-04-04 ENCOUNTER — Ambulatory Visit: Payer: Medicare Other | Admitting: Oncology

## 2016-05-01 NOTE — Progress Notes (Deleted)
[place]  Telephone:(336) [phone] [phone]) [phone]  ID: Jeff Patel OB: 1928-07-30  MR#: [id number]  [id number]#:[id number]  Patient Care Team: Maryland Pink, MD as PCP - General (Family Medicine) Ranee Gosselin, MD as Consulting Physician (Neurology)  CHIEF COMPLAINT: JAK-2 positive essential thrombocytosis.  INTERVAL HISTORY: Patient returns to clinic today for further evaluation and laboratory work. He continues to have extreme weakness and fatigue secondary to his diagnosis of myasthenia gravis. He continues to have a poor appetite and weight loss. He has occasional dyspnea on exertion. He has no new neurologic complaints. He denies any recent fevers or illnesses. He denies any chest pain, cough, or hemoptysis. He denies any nausea, vomiting, or constipation. He has no urinary complaints. Patient offers no further specific complaints today.  REVIEW OF SYSTEMS:   Review of Systems  Constitutional: Positive for malaise/fatigue. Negative for fever and weight loss.  Respiratory: Positive for shortness of breath. Negative for cough and hemoptysis.   Cardiovascular: Negative.  Negative for chest pain.  Gastrointestinal: Negative for abdominal pain and diarrhea.  Genitourinary: Negative.   Musculoskeletal: Negative.   Neurological: Positive for focal weakness and weakness.  Endo/Heme/Allergies: Does not bruise/bleed easily.  Psychiatric/Behavioral: Positive for depression. The patient is not nervous/anxious.    As per HPI. Otherwise, a complete review of systems is negative.   PAST MEDICAL HISTORY: Past Medical History:  Diagnosis Date  . Allergy   . Anxiety   . Arthritis    Osteoarthritis  . Bronchitis   . History of kidney cancer    Ablation surgery. Has a follow up 11/2014  . Hypertension    takes medication  . Kidney disease   . Macular degeneration   . Skin cancer     PAST SURGICAL HISTORY: Past Surgical History:  Procedure Laterality Date  .  TRANSURETHRAL RESECTION OF PROSTATE      FAMILY HISTORY: Reviewed and unchanged. No reported history of malignancy or chronic disease.    ADVANCED DIRECTIVES:    HEALTH MAINTENANCE: Social History  Substance Use Topics  . Smoking status: Former Smoker    Types: Cigarettes    Quit date: 02/01/1952  . Smokeless tobacco: Never Used  . Alcohol use No     Colonoscopy:  PAP:  Bone density:  Lipid panel:  Allergies  Allergen Reactions  . Other Other (See Comments) and Anaphylaxis    BEE STINGS BEE STINGS  . Adhesive [Tape]   . Ciprofloxacin   . Codeine   . Latex   . Levaquin [Levofloxacin In D5w]   . Quinolones Other (See Comments)  . Shellfish Allergy     Current Outpatient Prescriptions  Medication Sig Dispense Refill  . ALPRAZolam (XANAX) 0.25 MG tablet Take 0.25 mg by mouth at bedtime as needed.     Marland Kitchen amLODipine (NORVASC) 2.5 MG tablet Take 2.5 mg by mouth daily.    Marland Kitchen aspirin 81 MG tablet Take 81 mg by mouth daily.     . citalopram (CELEXA) 10 MG tablet Take 10 mg by mouth daily.     . fexofenadine (ALLEGRA) 180 MG tablet Take 180 mg by mouth daily.     . fluticasone (FLONASE) 50 MCG/ACT nasal spray 2 sprays by Each Nare route daily.    . Fluticasone-Salmeterol (ADVAIR DISKUS) 100-50 MCG/DOSE AEPB PRN    . furosemide (LASIX) 40 MG tablet Take 40 mg by mouth daily.     Marland Kitchen omeprazole (PRILOSEC) 20 MG capsule Take 20 mg by mouth daily.    Marland Kitchen  POTASSIUM CHLORIDE PO Take 1 tablet by mouth daily.     . tamsulosin (FLOMAX) 0.4 MG CAPS capsule Take 0.4 mg by mouth daily.      No current facility-administered medications for this visit.     OBJECTIVE: There were no vitals filed for this visit.   There is no height or weight on file to calculate BMI.    ECOG FS:1 - Symptomatic but completely ambulatory  General: Well-developed, well-nourished, no acute distress. Eyes: Pink conjunctiva, anicteric sclera. Lungs: Clear to auscultation bilaterally. Heart: Regular rate and  rhythm. No rubs, murmurs, or gallops. Abdomen: Soft, nontender, nondistended. No organomegaly noted, normoactive bowel sounds. Musculoskeletal: No edema, cyanosis, or clubbing. Neuro: Alert, answering all questions appropriately. Cranial nerves grossly intact. Skin: No rashes or petechiae noted. Psych: Normal affect.   LAB RESULTS:  Lab Results  Component Value Date   K 3.6 09/05/2011    Lab Results  Component Value Date   WBC 8.5 01/04/2016   NEUTROABS 5.5 01/04/2016   HGB 12.4 (L) 01/04/2016   HCT 36.8 (L) 01/04/2016   MCV 84.9 01/04/2016   PLT 640 (H) 01/04/2016   Lab Results  Component Value Date   IRON 44 (L) 08/31/2015   TIBC 263 08/31/2015   IRONPCTSAT 17 (L) 08/31/2015   Lab Results  Component Value Date   FERRITIN 91 08/31/2015     STUDIES: No results found.  ASSESSMENT: JAK-2 positive essential thrombocytosis.  PLAN:    1.JAK-2 positive essential thrombocytosis:  Patient's platelet count remains elevated, but essentially unchanged.  Patient noted to be positive for the JAK-2 mutation which puts him at increased risk for thrombotic complications. The remainder of his laboratory work, except for a mild iron deficiency anemia, is either negative or within normal limits. Patient will require Hydrea 500 mg daily, but will hold off on prescribing at this point. If patient's platelet count trends up and approaches 1000, will consider Hydrea at that point.  Patient reports he is having plasmapheresis as well as being placed on prednisone to help treat his myasthenia gravis. Return to clinic in 3 months with repeat laboratory work and further evaluation. 2. Myasthenia gravis: Continue evaluation and treatment per [facility] neurology. Prednisone and plasmapheresis as above. 3. Iron deficiency anemia: Mild, monitor.  Patient's neurologist:  Servando Salina, MD  [address]  [place], [place] [address]  [phone]  [phone] (Fax)    Approximately 30  minutes was spent in discussion of which greater than 50% was consultation.  Patient expressed understanding and was in agreement with this plan. He also understands that He can call clinic at any time with any questions, concerns, or complaints.   Lloyd Huger, MD   05/01/2016 10:50 PM

## 2016-05-02 ENCOUNTER — Inpatient Hospital Stay: Payer: Medicare Other | Attending: Oncology

## 2016-05-02 ENCOUNTER — Inpatient Hospital Stay: Payer: Medicare Other | Admitting: Oncology

## 2016-05-02 DIAGNOSIS — G7000 Myasthenia gravis without (acute) exacerbation: Secondary | ICD-10-CM | POA: Diagnosis not present

## 2016-05-02 DIAGNOSIS — D473 Essential (hemorrhagic) thrombocythemia: Secondary | ICD-10-CM

## 2016-05-02 DIAGNOSIS — D509 Iron deficiency anemia, unspecified: Secondary | ICD-10-CM | POA: Diagnosis not present

## 2016-05-02 LAB — CBC WITH DIFFERENTIAL/PLATELET
BASOS ABS: 0.1 10*3/uL (ref 0–0.1)
BASOS PCT: 0 %
EOS ABS: 0 10*3/uL (ref 0–0.7)
EOS PCT: 0 %
HCT: 37.2 % — ABNORMAL LOW (ref 40.0–52.0)
Hemoglobin: 12.7 g/dL — ABNORMAL LOW (ref 13.0–18.0)
Lymphocytes Relative: 5 %
Lymphs Abs: 0.9 10*3/uL — ABNORMAL LOW (ref 1.0–3.6)
MCH: 29.4 pg (ref 26.0–34.0)
MCHC: 34.1 g/dL (ref 32.0–36.0)
MCV: 86.2 fL (ref 80.0–100.0)
Monocytes Absolute: 1.3 10*3/uL — ABNORMAL HIGH (ref 0.2–1.0)
Monocytes Relative: 6 %
Neutro Abs: 18.2 10*3/uL — ABNORMAL HIGH (ref 1.4–6.5)
Neutrophils Relative %: 89 %
Platelets: 656 10*3/uL — ABNORMAL HIGH (ref 150–440)
RBC: 4.32 MIL/uL — AB (ref 4.40–5.90)
RDW: 15.2 % — AB (ref 11.5–14.5)
WBC: 20.6 10*3/uL — AB (ref 3.8–10.6)

## 2016-08-09 NOTE — Progress Notes (Deleted)
[place]  [phone]) [phone] Fax:(336) [phone]  ID: Carloyn Jaeger OB: 24-Apr-1928  MR#: [id number]  URK#:[id number]  Patient Care Team: Maryland Pink, MD as PCP - General (Family Medicine) Ranee Gosselin, MD as Consulting Physician (Neurology)  CHIEF COMPLAINT: JAK-2 positive essential thrombocytosis.  INTERVAL HISTORY: Patient returns to clinic today for further evaluation and laboratory work. He continues to have extreme weakness and fatigue secondary to his diagnosis of myasthenia gravis. He continues to have a poor appetite and weight loss. He has occasional dyspnea on exertion. He has no new neurologic complaints. He denies any recent fevers or illnesses. He denies any chest pain, cough, or hemoptysis. He denies any nausea, vomiting, or constipation. He has no urinary complaints. Patient offers no further specific complaints today.  REVIEW OF SYSTEMS:   Review of Systems  Constitutional: Positive for malaise/fatigue. Negative for fever and weight loss.  Respiratory: Positive for shortness of breath. Negative for cough and hemoptysis.   Cardiovascular: Negative.  Negative for chest pain.  Gastrointestinal: Negative for abdominal pain and diarrhea.  Genitourinary: Negative.   Musculoskeletal: Negative.   Neurological: Positive for focal weakness and weakness.  Endo/Heme/Allergies: Does not bruise/bleed easily.  Psychiatric/Behavioral: Positive for depression. The patient is not nervous/anxious.    As per HPI. Otherwise, a complete review of systems is negative.   PAST MEDICAL HISTORY: Past Medical History:  Diagnosis Date  . Allergy   . Anxiety   . Arthritis    Osteoarthritis  . Bronchitis   . History of kidney cancer    Ablation surgery. Has a follow up 11/2014  . Hypertension    takes medication  . Kidney disease   . Macular degeneration   . Skin cancer     PAST SURGICAL HISTORY: Past Surgical History:  Procedure Laterality Date    . TRANSURETHRAL RESECTION OF PROSTATE      FAMILY HISTORY: Reviewed and unchanged. No reported history of malignancy or chronic disease.    ADVANCED DIRECTIVES:    HEALTH MAINTENANCE: Social History  Substance Use Topics  . Smoking status: Former Smoker    Types: Cigarettes    Quit date: 02/01/1952  . Smokeless tobacco: Never Used  . Alcohol use No     Colonoscopy:  PAP:  Bone density:  Lipid panel:  Allergies  Allergen Reactions  . Other Other (See Comments) and Anaphylaxis    BEE STINGS BEE STINGS  . Adhesive [Tape]   . Ciprofloxacin   . Codeine   . Latex   . Levaquin [Levofloxacin In D5w]   . Quinolones Other (See Comments)  . Shellfish Allergy     Current Outpatient Prescriptions  Medication Sig Dispense Refill  . ALPRAZolam (XANAX) 0.25 MG tablet Take 0.25 mg by mouth at bedtime as needed.     Marland Kitchen amLODipine (NORVASC) 2.5 MG tablet Take 2.5 mg by mouth daily.    Marland Kitchen aspirin 81 MG tablet Take 81 mg by mouth daily.     . citalopram (CELEXA) 10 MG tablet Take 10 mg by mouth daily.     . fexofenadine (ALLEGRA) 180 MG tablet Take 180 mg by mouth daily.     . fluticasone (FLONASE) 50 MCG/ACT nasal spray 2 sprays by Each Nare route daily.    . Fluticasone-Salmeterol (ADVAIR DISKUS) 100-50 MCG/DOSE AEPB PRN    . furosemide (LASIX) 40 MG tablet Take 40 mg by mouth daily.     Marland Kitchen omeprazole (PRILOSEC) 20 MG capsule Take 20 mg by mouth daily.    Marland Kitchen  POTASSIUM CHLORIDE PO Take 1 tablet by mouth daily.     . tamsulosin (FLOMAX) 0.4 MG CAPS capsule Take 0.4 mg by mouth daily.      No current facility-administered medications for this visit.     OBJECTIVE: There were no vitals filed for this visit.   There is no height or weight on file to calculate BMI.    ECOG FS:1 - Symptomatic but completely ambulatory  General: Well-developed, well-nourished, no acute distress. Eyes: Pink conjunctiva, anicteric sclera. Lungs: Clear to auscultation bilaterally. Heart: Regular rate and  rhythm. No rubs, murmurs, or gallops. Abdomen: Soft, nontender, nondistended. No organomegaly noted, normoactive bowel sounds. Musculoskeletal: No edema, cyanosis, or clubbing. Neuro: Alert, answering all questions appropriately. Cranial nerves grossly intact. Skin: No rashes or petechiae noted. Psych: Normal affect.   LAB RESULTS:  Lab Results  Component Value Date   K 3.6 09/05/2011    Lab Results  Component Value Date   WBC 20.6 (H) 05/02/2016   NEUTROABS 18.2 (H) 05/02/2016   HGB 12.7 (L) 05/02/2016   HCT 37.2 (L) 05/02/2016   MCV 86.2 05/02/2016   PLT 656 (H) 05/02/2016   Lab Results  Component Value Date   IRON 44 (L) 08/31/2015   TIBC 263 08/31/2015   IRONPCTSAT 17 (L) 08/31/2015   Lab Results  Component Value Date   FERRITIN 91 08/31/2015     STUDIES: No results found.  ASSESSMENT: JAK-2 positive essential thrombocytosis.  PLAN:    1.JAK-2 positive essential thrombocytosis:  Patient's platelet count remains elevated, but essentially unchanged.  Patient noted to be positive for the JAK-2 mutation which puts him at increased risk for thrombotic complications. The remainder of his laboratory work, except for a mild iron deficiency anemia, is either negative or within normal limits. Patient will require Hydrea 500 mg daily, but will hold off on prescribing at this point. If patient's platelet count trends up and approaches 1000, will consider Hydrea at that point.  Patient reports he is having plasmapheresis as well as being placed on prednisone to help treat his myasthenia gravis. Return to clinic in 3 months with repeat laboratory work and further evaluation. 2. Myasthenia gravis: Continue evaluation and treatment per [facility] neurology. Prednisone and plasmapheresis as above. 3. Iron deficiency anemia: Mild, monitor.  Patient's neurologist:  Servando Salina, MD  [address]  [place], [place] [address]  [phone]  [phone] (Fax)     Approximately 30 minutes was spent in discussion of which greater than 50% was consultation.  Patient expressed understanding and was in agreement with this plan. He also understands that He can call clinic at any time with any questions, concerns, or complaints.   Lloyd Huger, MD   08/09/2016 9:55 PM

## 2016-08-11 ENCOUNTER — Inpatient Hospital Stay: Payer: Medicare Other

## 2016-08-11 ENCOUNTER — Inpatient Hospital Stay: Payer: Medicare Other | Admitting: Oncology

## 2016-08-19 ENCOUNTER — Encounter (INDEPENDENT_AMBULATORY_CARE_PROVIDER_SITE_OTHER): Payer: Self-pay | Admitting: Vascular Surgery

## 2016-09-09 ENCOUNTER — Encounter (INDEPENDENT_AMBULATORY_CARE_PROVIDER_SITE_OTHER): Payer: Self-pay | Admitting: Vascular Surgery

## 2016-09-14 NOTE — Progress Notes (Signed)
[place]  [phone]) [phone] Fax:(336) [phone]  ID: Jeff Patel OB: Feb 18, 1928  MR#: [id number]  QIO#:[id number]  Patient Care Team: Maryland Pink, MD as PCP - General (Family Medicine) Ranee Gosselin, MD as Consulting Physician (Neurology)  CHIEF COMPLAINT: JAK-2 positive essential thrombocytosis.  INTERVAL HISTORY: Patient returns to clinic today for further evaluation and laboratory work. He continues to have extreme weakness and fatigue secondary to his diagnosis of myasthenia gravis. He has occasional dyspnea on exertion. He has no new neurologic complaints. He denies any recent fevers or illnesses. He denies any chest pain, cough, or hemoptysis. He denies any nausea, vomiting, or constipation. He has no urinary complaints. Patient offers no further specific complaints today.  REVIEW OF SYSTEMS:   Review of Systems  Constitutional: Positive for malaise/fatigue. Negative for fever and weight loss.  Respiratory: Positive for shortness of breath. Negative for cough and hemoptysis.   Cardiovascular: Negative.  Negative for chest pain.  Gastrointestinal: Negative for abdominal pain and diarrhea.  Genitourinary: Negative.   Musculoskeletal: Negative.   Neurological: Positive for focal weakness and weakness.  Endo/Heme/Allergies: Does not bruise/bleed easily.  Psychiatric/Behavioral: Negative.  Negative for depression. The patient is not nervous/anxious.    As per HPI. Otherwise, a complete review of systems is negative.   PAST MEDICAL HISTORY: Past Medical History:  Diagnosis Date  . Allergy   . Anxiety   . Arthritis    Osteoarthritis  . Bronchitis   . History of kidney cancer    Ablation surgery. Has a follow up 11/2014  . Hypertension    takes medication  . Kidney disease   . Macular degeneration   . Skin cancer     PAST SURGICAL HISTORY: Past Surgical History:  Procedure Laterality Date  . TRANSURETHRAL RESECTION OF PROSTATE       FAMILY HISTORY: Reviewed and unchanged. No reported history of malignancy or chronic disease.    ADVANCED DIRECTIVES:    HEALTH MAINTENANCE: Social History  Substance Use Topics  . Smoking status: Former Smoker    Types: Cigarettes    Quit date: 02/01/1952  . Smokeless tobacco: Never Used  . Alcohol use No     Colonoscopy:  PAP:  Bone density:  Lipid panel:  Allergies  Allergen Reactions  . Other Other (See Comments) and Anaphylaxis    BEE STINGS BEE STINGS  . Adhesive [Tape]   . Ciprofloxacin   . Codeine   . Latex   . Levaquin [Levofloxacin In D5w]   . Quinolones Other (See Comments)  . Shellfish Allergy     Current Outpatient Prescriptions  Medication Sig Dispense Refill  . aspirin 81 MG tablet Take 81 mg by mouth daily.     . citalopram (CELEXA) 10 MG tablet Take 10 mg by mouth daily.     . fexofenadine (ALLEGRA) 180 MG tablet Take 180 mg by mouth daily.     . fluticasone (FLONASE) 50 MCG/ACT nasal spray 2 sprays by Each Nare route daily.    . Fluticasone-Salmeterol (ADVAIR DISKUS) 100-50 MCG/DOSE AEPB PRN    . furosemide (LASIX) 40 MG tablet Take 40 mg by mouth daily.     Marland Kitchen lisinopril (PRINIVIL,ZESTRIL) 10 MG tablet Take 10 mg by mouth.    Marland Kitchen omeprazole (PRILOSEC) 20 MG capsule Take 20 mg by mouth daily.    Marland Kitchen POTASSIUM CHLORIDE PO Take 1 tablet by mouth daily.     . tamsulosin (FLOMAX) 0.4 MG CAPS capsule Take 0.4 mg by mouth daily.     Marland Kitchen  ALPRAZolam (XANAX) 0.25 MG tablet Take 0.25 mg by mouth at bedtime as needed.      No current facility-administered medications for this visit.     OBJECTIVE: Vitals:   09/15/16 1615  BP: (!) 150/95  Pulse: 94  Resp: 20  Temp: (!) 96.8 F (36 C)     Body mass index is 28.62 kg/m.    ECOG FS:1 - Symptomatic but completely ambulatory  General: Well-developed, well-nourished, no acute distress. Eyes: Pink conjunctiva, anicteric sclera. Lungs: Clear to auscultation bilaterally. Heart: Regular rate and rhythm. No  rubs, murmurs, or gallops. Abdomen: Soft, nontender, nondistended. No organomegaly noted, normoactive bowel sounds. Musculoskeletal: No edema, cyanosis, or clubbing. Neuro: Alert, answering all questions appropriately. Cranial nerves grossly intact. Skin: No rashes or petechiae noted. Psych: Normal affect.   LAB RESULTS:  Lab Results  Component Value Date   K 3.6 09/05/2011    Lab Results  Component Value Date   WBC 11.5 (H) 09/15/2016   NEUTROABS 10.6 (H) 09/15/2016   HGB 12.9 (L) 09/15/2016   HCT 39.1 (L) 09/15/2016   MCV 87.9 09/15/2016   PLT 604 (H) 09/15/2016   Lab Results  Component Value Date   IRON 44 (L) 08/31/2015   TIBC 263 08/31/2015   IRONPCTSAT 17 (L) 08/31/2015   Lab Results  Component Value Date   FERRITIN 91 08/31/2015     STUDIES: No results found.  ASSESSMENT: JAK-2 positive essential thrombocytosis.  PLAN:    1.JAK-2 positive essential thrombocytosis:  Patient's platelet count remains elevated, but essentially unchanged.  Patient noted to be positive for the JAK-2 mutation which puts him at increased risk for thrombotic complications. Continue 81 mg aspirin daily. The remainder of his laboratory work, except for a mild iron deficiency anemia, is either negative or within normal limits. Patient will likely require Hydrea 500 mg daily in the future, but will hold off on prescribing at this point. If patient's platelet count trends up and approaches 1000, will consider treatment at that point. Return to clinic in 6 months with repeat laboratory work and further evaluation. 2. Myasthenia gravis: Continue evaluation and treatment with prednisone and plasmapheresis as per [facility] neurology. 3. Iron deficiency anemia: Mild, monitor.  Patient's neurologist:  Servando Salina, MD  [address]  [place], [place] [address]  [phone]  [phone] (Fax)    Approximately 30 minutes was spent in discussion of which greater than 50% was  consultation.  Patient expressed understanding and was in agreement with this plan. He also understands that He can call clinic at any time with any questions, concerns, or complaints.   Lloyd Huger, MD   09/16/2016 2:07 PM

## 2016-09-15 ENCOUNTER — Inpatient Hospital Stay (HOSPITAL_BASED_OUTPATIENT_CLINIC_OR_DEPARTMENT_OTHER): Payer: Medicare Other | Admitting: Oncology

## 2016-09-15 ENCOUNTER — Other Ambulatory Visit: Payer: Self-pay | Admitting: *Deleted

## 2016-09-15 ENCOUNTER — Inpatient Hospital Stay: Payer: Medicare Other | Attending: Oncology

## 2016-09-15 VITALS — BP 150/95 | HR 94 | Temp 96.8°F | Resp 20 | Wt 205.2 lb

## 2016-09-15 DIAGNOSIS — H3530 Unspecified macular degeneration: Secondary | ICD-10-CM | POA: Insufficient documentation

## 2016-09-15 DIAGNOSIS — R5383 Other fatigue: Secondary | ICD-10-CM

## 2016-09-15 DIAGNOSIS — Z85528 Personal history of other malignant neoplasm of kidney: Secondary | ICD-10-CM

## 2016-09-15 DIAGNOSIS — Z7982 Long term (current) use of aspirin: Secondary | ICD-10-CM | POA: Diagnosis not present

## 2016-09-15 DIAGNOSIS — R5381 Other malaise: Secondary | ICD-10-CM | POA: Diagnosis not present

## 2016-09-15 DIAGNOSIS — Z85828 Personal history of other malignant neoplasm of skin: Secondary | ICD-10-CM

## 2016-09-15 DIAGNOSIS — M1990 Unspecified osteoarthritis, unspecified site: Secondary | ICD-10-CM

## 2016-09-15 DIAGNOSIS — D509 Iron deficiency anemia, unspecified: Secondary | ICD-10-CM

## 2016-09-15 DIAGNOSIS — R531 Weakness: Secondary | ICD-10-CM | POA: Insufficient documentation

## 2016-09-15 DIAGNOSIS — Z79899 Other long term (current) drug therapy: Secondary | ICD-10-CM

## 2016-09-15 DIAGNOSIS — Z87891 Personal history of nicotine dependence: Secondary | ICD-10-CM

## 2016-09-15 DIAGNOSIS — R0609 Other forms of dyspnea: Secondary | ICD-10-CM

## 2016-09-15 DIAGNOSIS — D473 Essential (hemorrhagic) thrombocythemia: Secondary | ICD-10-CM

## 2016-09-15 DIAGNOSIS — G7000 Myasthenia gravis without (acute) exacerbation: Secondary | ICD-10-CM | POA: Diagnosis not present

## 2016-09-15 DIAGNOSIS — N289 Disorder of kidney and ureter, unspecified: Secondary | ICD-10-CM | POA: Insufficient documentation

## 2016-09-15 DIAGNOSIS — I10 Essential (primary) hypertension: Secondary | ICD-10-CM | POA: Diagnosis not present

## 2016-09-15 LAB — CBC WITH DIFFERENTIAL/PLATELET
Basophils Absolute: 0 10*3/uL (ref 0–0.1)
Basophils Relative: 0 %
EOS ABS: 0 10*3/uL (ref 0–0.7)
Eosinophils Relative: 0 %
HCT: 39.1 % — ABNORMAL LOW (ref 40.0–52.0)
HEMOGLOBIN: 12.9 g/dL — AB (ref 13.0–18.0)
LYMPHS ABS: 0.7 10*3/uL — AB (ref 1.0–3.6)
Lymphocytes Relative: 6 %
MCH: 29 pg (ref 26.0–34.0)
MCHC: 33 g/dL (ref 32.0–36.0)
MCV: 87.9 fL (ref 80.0–100.0)
MONO ABS: 0.2 10*3/uL (ref 0.2–1.0)
MONOS PCT: 2 %
NEUTROS PCT: 92 %
Neutro Abs: 10.6 10*3/uL — ABNORMAL HIGH (ref 1.4–6.5)
Platelets: 604 10*3/uL — ABNORMAL HIGH (ref 150–440)
RBC: 4.45 MIL/uL (ref 4.40–5.90)
RDW: 15.8 % — AB (ref 11.5–14.5)
WBC: 11.5 10*3/uL — ABNORMAL HIGH (ref 3.8–10.6)

## 2016-09-15 NOTE — Progress Notes (Signed)
Patient denies any concerns today.  

## 2016-11-19 ENCOUNTER — Emergency Department: Payer: Medicare Other

## 2016-11-19 ENCOUNTER — Emergency Department
Admission: EM | Admit: 2016-11-19 | Discharge: 2016-11-20 | Disposition: A | Discharge status: Other Acute Inpatient Hospital | Payer: Medicare Other | Attending: Emergency Medicine | Admitting: Emergency Medicine

## 2016-11-19 DIAGNOSIS — Z85528 Personal history of other malignant neoplasm of kidney: Secondary | ICD-10-CM | POA: Diagnosis not present

## 2016-11-19 DIAGNOSIS — G7001 Myasthenia gravis with (acute) exacerbation: Secondary | ICD-10-CM | POA: Diagnosis not present

## 2016-11-19 DIAGNOSIS — R0602 Shortness of breath: Secondary | ICD-10-CM

## 2016-11-19 DIAGNOSIS — Z79899 Other long term (current) drug therapy: Secondary | ICD-10-CM | POA: Diagnosis not present

## 2016-11-19 DIAGNOSIS — I10 Essential (primary) hypertension: Secondary | ICD-10-CM | POA: Insufficient documentation

## 2016-11-19 DIAGNOSIS — Z7982 Long term (current) use of aspirin: Secondary | ICD-10-CM | POA: Diagnosis not present

## 2016-11-19 DIAGNOSIS — Z87891 Personal history of nicotine dependence: Secondary | ICD-10-CM | POA: Insufficient documentation

## 2016-11-19 DIAGNOSIS — Z91040 Latex allergy status: Secondary | ICD-10-CM | POA: Insufficient documentation

## 2016-11-19 DIAGNOSIS — Z85828 Personal history of other malignant neoplasm of skin: Secondary | ICD-10-CM | POA: Diagnosis not present

## 2016-11-19 DIAGNOSIS — R1030 Lower abdominal pain, unspecified: Secondary | ICD-10-CM | POA: Diagnosis present

## 2016-11-19 DIAGNOSIS — N390 Urinary tract infection, site not specified: Secondary | ICD-10-CM

## 2016-11-19 LAB — CBC WITH DIFFERENTIAL/PLATELET
BASOS PCT: 0 %
Basophils Absolute: 0 10*3/uL (ref 0–0.1)
EOS ABS: 0 10*3/uL (ref 0–0.7)
EOS PCT: 0 %
HCT: 44.2 % (ref 40.0–52.0)
HEMOGLOBIN: 14.8 g/dL (ref 13.0–18.0)
Lymphocytes Relative: 5 %
Lymphs Abs: 0.4 10*3/uL — ABNORMAL LOW (ref 1.0–3.6)
MCH: 29.3 pg (ref 26.0–34.0)
MCHC: 33.4 g/dL (ref 32.0–36.0)
MCV: 87.5 fL (ref 80.0–100.0)
MONOS PCT: 1 %
Monocytes Absolute: 0.1 10*3/uL — ABNORMAL LOW (ref 0.2–1.0)
NEUTROS PCT: 94 %
Neutro Abs: 8.7 10*3/uL — ABNORMAL HIGH (ref 1.4–6.5)
Platelets: 433 10*3/uL (ref 150–440)
RBC: 5.05 MIL/uL (ref 4.40–5.90)
RDW: 16.1 % — ABNORMAL HIGH (ref 11.5–14.5)
WBC: 9.3 10*3/uL (ref 3.8–10.6)

## 2016-11-19 LAB — URINALYSIS, COMPLETE (UACMP) WITH MICROSCOPIC
BILIRUBIN URINE: NEGATIVE
Glucose, UA: NEGATIVE mg/dL
HGB URINE DIPSTICK: NEGATIVE
KETONES UR: NEGATIVE mg/dL
NITRITE: NEGATIVE
PH: 5 (ref 5.0–8.0)
Protein, ur: 30 mg/dL — AB
SPECIFIC GRAVITY, URINE: 1.019 (ref 1.005–1.030)

## 2016-11-19 LAB — COMPREHENSIVE METABOLIC PANEL
ALK PHOS: 72 U/L (ref 38–126)
ALT: 10 U/L — AB (ref 17–63)
AST: 27 U/L (ref 15–41)
Albumin: 3.5 g/dL (ref 3.5–5.0)
Anion gap: 11 (ref 5–15)
BUN: 35 mg/dL — AB (ref 6–20)
CALCIUM: 8.9 mg/dL (ref 8.9–10.3)
CO2: 25 mmol/L (ref 22–32)
CREATININE: 1.79 mg/dL — AB (ref 0.61–1.24)
Chloride: 103 mmol/L (ref 101–111)
GFR, EST AFRICAN AMERICAN: 38 mL/min — AB (ref >60)
GFR, EST NON AFRICAN AMERICAN: 33 mL/min — AB (ref >60)
Glucose, Bld: 145 mg/dL — ABNORMAL HIGH (ref 65–99)
Potassium: 6 mmol/L — ABNORMAL HIGH (ref 3.5–5.1)
SODIUM: 139 mmol/L (ref 135–145)
Total Bilirubin: 1.1 mg/dL (ref 0.3–1.2)
Total Protein: 6.4 g/dL — ABNORMAL LOW (ref 6.5–8.1)

## 2016-11-19 MED ORDER — TRAMADOL HCL 50 MG PO TABS: 50.0000 mg | ORAL_TABLET | Freq: Three times a day (TID) | ORAL | 0 refills | Status: AC | PRN

## 2016-11-19 MED ORDER — CEFTRIAXONE SODIUM IN DEXTROSE 20 MG/ML IV SOLN
1.0000 g | Freq: Once | INTRAVENOUS | Status: AC
  Administered 2016-11-20: 1 g via INTRAVENOUS
  Filled 2016-11-19: qty 50

## 2016-11-19 MED ORDER — CEPHALEXIN 500 MG PO CAPS: 500.0000 mg | ORAL_CAPSULE | Freq: Two times a day (BID) | ORAL | 0 refills | Status: AC

## 2016-11-19 MED ORDER — TRAMADOL HCL 50 MG PO TABS
50.0000 mg | ORAL_TABLET | ORAL | Status: AC
  Administered 2016-11-19: 50 mg via ORAL
  Filled 2016-11-19: qty 1

## 2016-11-19 MED ORDER — SODIUM CHLORIDE 0.9 % IV BOLUS (SEPSIS)
1000.0000 mL | Freq: Once | INTRAVENOUS | Status: AC
  Administered 2016-11-20: 1000 mL via INTRAVENOUS

## 2016-11-19 NOTE — ED Provider Notes (Addendum)
[facility] Emergency Department Provider Note   ____________________________________________   First MD Initiated Contact with Patient 11/19/16 2207     (approximate)  I have reviewed the triage vital signs and the nursing notes.   HISTORY  Chief Complaint Bladder Pain    HPI Jeff Patel is a 83 y.o. male here for evaluation of pain in his lower abdomen.  Patient describes this pain in the bladder, since yesterday evening he started to experience increased urgency to urinate.  Patient reports a moderate to severe pain over the lower abdomen reports he is having pain and spasms in the bladder.  Reports the pain is severe at times, worse when he has to urge to urinate.  No nausea vomiting fever or chills.  He has experienced a burning feeling with urination the last day.  He reports he is concerned he may have an infection in the bladder.  No associated back pain.  No nausea or vomiting.  Does have a notable history for previous prostate cancer.  He is previously had to have Foley catheter in the past, but reports he does not believe the bladder is blocked as much as it feels like it hurts especially when he tries to urinate.  In addition, the patient does report a history of myasthenia gravis but reports that was not treatable with Mestinon but he has had to have previous plasmapheresis.  He denies any recent weakness of the muscles or any trouble breathing though, and reports this seems to be getting better.  Past Medical History:  Diagnosis Date  . Allergy   . Anxiety   . Arthritis    Osteoarthritis  . Bronchitis   . History of kidney cancer    Ablation surgery. Has a follow up 11/2014  . Hypertension    takes medication  . Kidney disease   . Macular degeneration   . Skin cancer     Patient Active Problem List   Diagnosis Date Noted  . 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  TRANSURETHRAL RESECTION OF PROSTATE      Prior to Admission medications   Medication Sig Start Date End Date Taking? Authorizing Provider  ALPRAZolam (XANAX) 0.25 MG tablet Take 0.25 mg by mouth at bedtime as needed.     [provider]  aspirin 81 MG tablet Take 81 mg by mouth daily.     [provider]  cephALEXin (KEFLEX) 500 MG capsule Take 1 capsule (500 mg total) by mouth 2 (two) times daily. 11/19/16   Delman Kitten, MD  citalopram (CELEXA) 10 MG tablet Take 10 mg by mouth daily.  08/03/15   [provider]  fexofenadine (ALLEGRA) 180 MG tablet Take 180 mg by mouth daily.     [provider]  fluticasone (FLONASE) 50 MCG/ACT nasal spray 2 sprays by Each Nare route daily.    [provider]  Fluticasone-Salmeterol (ADVAIR DISKUS) 100-50 MCG/DOSE AEPB PRN 06/10/13   [provider]  furosemide (LASIX) 40 MG tablet Take 40 mg by mouth daily.     [provider]  lisinopril (PRINIVIL,ZESTRIL) 10 MG tablet Take 10 mg by mouth. 08/18/16 08/18/17  [provider]  omeprazole (PRILOSEC) 20 MG capsule Take 20 mg by mouth daily.    [provider]  POTASSIUM CHLORIDE PO Take 1 tablet by mouth daily.     [provider]  tamsulosin (FLOMAX) 0.4 MG CAPS capsule Take 0.4  mg by mouth daily.     [provider]  traMADol (ULTRAM) 50 MG tablet Take 1 tablet (50 mg total) by mouth every 8 (eight) hours as needed. 11/19/16   Delman Kitten, MD    Allergies Other; Adhesive [tape]; Ciprofloxacin; Codeine; Latex; Levaquin [levofloxacin in d5w]; Quinolones; and Shellfish allergy  No family history on file.  Social History Social History  Substance Use Topics  . Smoking status: Former Smoker    Types: Cigarettes    Quit date: 02/01/1952  . Smokeless tobacco: Never Used  . Alcohol use No    Review of Systems Constitutional: No fever/chills Eyes: No visual changes. ENT: No sore throat. Cardiovascular: Denies  chest pain. Respiratory: Denies shortness of breath. Gastrointestinal: No upper abdominal pain.  Reports pain in the lower abdomen especially with attempt to urinate and increased frequency no nausea, no vomiting.  No diarrhea.  No constipation. Genitourinary: See HPI Musculoskeletal: Negative for back pain. Skin: Negative for rash. Neurological: Negative for headaches, focal weakness or numbness.    ____________________________________________   PHYSICAL EXAM:  VITAL SIGNS: ED Triage Vitals  Enc Vitals Group     BP 11/19/16 2135 (!) 189/105     Pulse Rate 11/19/16 2135 80     Resp 11/19/16 2135 18     Temp 11/19/16 2135 97.6 F (36.4 C)     Temp Source 11/19/16 2135 Oral     SpO2 11/19/16 2135 96 %     Weight 11/19/16 2128 199 lb (90.3 kg)     Height 11/19/16 2128 5\' 9"  (1.753 m)     Head Circumference --      Peak Flow --      Pain Score 11/19/16 2133 9     Pain Loc --      Pain Edu? --      Excl. in [place]? --     Constitutional: Alert and oriented. Well appearing and in no acute distress though he does report pain and looks uncomfortable pointing towards to the suprapubic region. Eyes: Conjunctivae are normal. Head: Atraumatic. Nose: No congestion/rhinnorhea. Mouth/Throat: Mucous membranes are moist. Neck: No stridor.   Cardiovascular: Normal rate, regular rhythm. Grossly normal heart sounds.  Good peripheral circulation. Respiratory: Normal respiratory effort.  No retractions. Lungs CTAB. Gastrointestinal: Soft and nontender except for moderate tenderness suprapubically but without notable bladder distention palpable. No distention.  No CVA tenderness Musculoskeletal: No lower extremity tenderness nor edema.  Changes of chronic venous stasis are notable. Neurologic:  Normal speech and language. No gross focal neurologic deficits are appreciated.  Skin:  Skin is warm, dry and intact. No rash noted. Psychiatric: Mood and affect are normal. Speech and behavior are  normal.  ____________________________________________   LABS (all labs ordered are listed, but only abnormal results are displayed)  Labs Reviewed  URINALYSIS, COMPLETE (UACMP) WITH MICROSCOPIC - Abnormal; Notable for the following:       Result Value   Color, Urine YELLOW (*)    APPearance CLOUDY (*)    Protein, ur 30 (*)    Leukocytes, UA MODERATE (*)    Bacteria, UA RARE (*)    Squamous Epithelial / LPF 0-5 (*)    All other components within normal limits  CBC WITH DIFFERENTIAL/PLATELET - Abnormal; Notable for the following:    RDW 16.1 (*)    Neutro Abs 8.7 (*)    Lymphs Abs 0.4 (*)    Monocytes Absolute 0.1 (*)    All other components within normal limits  COMPREHENSIVE  METABOLIC PANEL - Abnormal; Notable for the following:    Potassium 6.0 (*)    Glucose, Bld 145 (*)    BUN 35 (*)    Creatinine, Ser 1.79 (*)    Total Protein 6.4 (*)    ALT 10 (*)    GFR calc non Af Amer 33 (*)    GFR calc Af Amer 38 (*)    All other components within normal limits  URINE CULTURE  CULTURE, BLOOD (ROUTINE X 2)  CULTURE, BLOOD (ROUTINE X 2)  GLUCOSE, CAPILLARY  POTASSIUM  LACTIC ACID, PLASMA  LACTIC ACID, PLASMA  BLOOD GAS, ARTERIAL  CBG MONITORING, ED  CBG MONITORING, ED   ____________________________________________  EKG  EKG performed at 11:40 AM Heart rate 150 QRS 70 QTC 430 Sinus tachycardia, minimal nonspecific T wave abnormality, no evidence of ischemia or hyperkalemia is noted ____________________________________________  RADIOLOGY  Dg Chest 1 View  Result Date: 11/20/2016 CLINICAL DATA:  Central line placement EXAM: CHEST 1 VIEW COMPARISON:  0036 hours on the same day FINDINGS: New right IJ central line catheter tip is seen at the cavoatrial junction. Endotracheal tube tip is 5.5 cm above the carina in satisfactory position. Heart is top-normal size. There is aortic atherosclerosis. Lung volumes are low with atelectasis at the left lung base versus scarring. No  pneumothorax is visualized. No pulmonary consolidation is seen. IMPRESSION: 1. Central line catheter tip at the cavoatrial junction without pneumothorax. 2. The tip of an endotracheal tube is 5.5 cm above the carina in satisfactory position. 3. Low lung volumes with left basilar atelectasis and/or scarring. 4. Aortic atherosclerosis without aneurysm. Electronically Signed   By: Ashley Royalty M.D.   On: 11/20/2016 02:03   Dg Chest Port 1 View  Result Date: 11/20/2016 CLINICAL DATA:  Post intubation. Respiratory distress. Shortness of breath peer EXAM: PORTABLE CHEST 1 VIEW COMPARISON:  06/30/2009 FINDINGS: Endotracheal tube has been placed with tip measuring 5.8 cm above the carina. Shallow inspiration with linear atelectasis in the lung bases. Cardiac enlargement. Pulmonary vascularity appears normal for technique. No edema or consolidation. No blunting of costophrenic angles. No pneumothorax. Degenerative changes in the spine. IMPRESSION: Endotracheal tube appears appropriately positioned. Shallow inspiration with atelectasis in the lung bases. Electronically Signed   By: Lucienne Capers M.D.   On: 11/20/2016 01:30   Ct Renal Stone Study  Result Date: 11/20/2016 CLINICAL DATA:  Initial evaluation for acute lower abdominal pain, urinary urgency. EXAM: CT ABDOMEN AND PELVIS WITHOUT CONTRAST TECHNIQUE: Multidetector CT imaging of the abdomen and pelvis was performed following the standard protocol without IV contrast. COMPARISON:  None available. FINDINGS: Lower chest: Atelectatic changes seen dependently within the visualized lung bases. Visualized lungs are otherwise clear. Prominent coronary artery calcifications. Hepatobiliary: Limited noncontrast evaluation of the liver is grossly unremarkable. Gallbladder appears to be absent. Mild prominence of the common bile duct likely related post cholecystectomy changes. Pancreas: Pancreas within normal limits. Spleen: Spleen within normal limits. Adrenals/Urinary  Tract: Adrenal glands are normal. On the left, there is a 4 mm stone positioned within the bladder lumen just beyond the left UVJ (series 2, image 85). Secondary mild to moderate left hydroureteronephrosis. No other radiopaque calculi seen within the mildly dilated left ureter. Additional nonobstructive 6 mm stone present within the lower pole left kidney. 7 mm hyperdense lesion extending from the lower pole left kidney is too small the characterize, but may reflect a small proteinaceous and/ or hemorrhagic cyst. 2.8 cm somewhat partially cystic heterogeneous lesion at the  posterior aspect of the right kidney with surrounding fat necrosis, consistent with history of prior ablation for renal cell carcinoma. Evaluation for residual malignancy limited on this noncontrast examination. 15 mm cyst noted within knee adjacent upper pole the right kidney. Nonobstructive calculi measuring up to 4 mm present within the lower pole of the right kidney. No obstructive radiopaque calculi seen along the course of the right renal collecting system. No right-sided hydronephrosis or hydroureter. Bladder largely decompressed without additional acute abnormality. Stomach/Bowel: Small hiatal hernia noted. Stomach moderately distended without acute abnormality. No evidence for bowel obstruction. Extensive sigmoid diverticulosis without evidence for acute diverticulitis. No acute inflammatory changes seen about the bowels. Vascular/Lymphatic: Moderate aorto bi-iliac atherosclerotic disease. No aneurysm. No adenopathy. Reproductive: Prostate normal. Other: Fat containing ventral hernia noted without associated inflammation. No free air or fluid. Musculoskeletal: No acute osseus abnormality. No worrisome lytic or blastic osseous lesions. IMPRESSION: 1. 4 mm obstructive stone position within the bladder lumen just beyond the left UVJ with secondary mild to moderate left hydroureteronephrosis. 2. Additional bilateral nonobstructive  nephrolithiasis as above. 3. 2.8 cm complex lesion with surrounding fat necrosis at the right kidney, consistent with history of prior ablation therapy for renal malignancy. Evaluation for residual tumor limited on this noncontrast examination. 4. Sigmoid diverticulosis without evidence for acute diverticulitis. 5. Aortic atherosclerosis with prominent 3 vessel coronary artery calcifications. Electronically Signed   By: Jeannine Boga M.D.   On: 11/20/2016 00:01   I personally interpreted and reviewed the patient's post line placement chest x-ray.  There is no pneumothorax.  The line is appropriately placed in the right IJ, and endotracheal tube remains in good position.  Reviewed post intubation x-rays, as well as CT scan.  CT concerning primarily for a obstructive left kidney stone at the UVJ with hydronephrosis. ____________________________________________   PROCEDURES  Procedure(s) performed: Intubation, central line placement  Procedures   INTUBATION Performed by: Delman Kitten  Required items: required blood products, implants, devices, and special equipment available Patient identity confirmed: provided demographic data and hospital-assigned identification number Time out: Immediately prior to procedure a "time out" was called to verify the correct patient, procedure, equipment, support staff and site/side marked as required.  Indications: Respiratory failure  Intubation method: Glidescope Laryngoscopy   Preoxygenation: BVM  Sedatives: Etomidate Paralytic: Vecuronium  Tube Size: 7.5 cuffed  Post-procedure assessment: chest rise and ETCO2 monitor Breath sounds: equal and absent over the epigastrium Tube secured with: ETT holder Chest x-ray interpreted by radiologist and me.  Chest x-ray findings: endotracheal tube in appropriate position  Patient tolerated the procedure well with no immediate complications.   CENTRAL LINE Performed by: Delman Kitten Consent: The  procedure was performed in an emergent situation.  The patient lost IV access shortly after intubation, he did have a emergent IO placed and then needed additional access so a line was placed emergently. Required items: required blood products, implants, devices, and special equipment available Patient identity confirmed: arm band and provided demographic data Time out: Immediately prior to procedure a "time out" was called to verify the correct patient, procedure, equipment, support staff and site/side marked as required. Indications: vascular access Patient sedated: yes Preparation: skin prepped with 2% chlorhexidine Skin prep agent dried: skin prep agent completely dried prior to procedure Sterile barriers: all five maximum sterile barriers used - cap, mask, sterile gown, sterile gloves, and large sterile sheet Hand hygiene: hand hygiene performed prior to central venous catheter insertion  Location details: right internal jugular vv.  Catheter  type: triple lumen Catheter size: 8 Fr Pre-procedure: landmarks identified Ultrasound guidance: yes Successful placement: yes Post-procedure: line sutured and dressing applied Assessment: blood return through all parts, free fluid flow, placement verified by x-ray and no pneumothorax on x-ray Patient tolerance: Patient tolerated the procedure well with no immediate complications.   Critical Care performed: Yes, see critical care note(s)  CRITICAL CARE Performed by: Delman Kitten   Total critical care time: 80 minutes  Critical care time was exclusive of separately billable procedures and treating other patients.  Critical care was necessary to treat or prevent imminent or life-threatening deterioration.  Critical care was time spent personally by me on the following activities: development of treatment plan with patient and/or surrogate as well as nursing, discussions with consultants, evaluation of patient's response to treatment,  examination of patient, obtaining history from patient or surrogate, ordering and performing treatments and interventions, ordering and review of laboratory studies, ordering and review of radiographic studies, pulse oximetry and re-evaluation of patient's condition.  Patient required extensive critical care as he decompensated rapidly due to myasthenic crisis.   ____________________________________________   INITIAL IMPRESSION / ASSESSMENT AND PLAN / ED COURSE  Pertinent labs & imaging results that were available during my care of the patient were reviewed by me and considered in my medical decision making (see chart for details).  Patient presents for lower abdominal pain with discomfort with urinating.  Notable suprapubic tenderness, urine does appear hazy with leukocytes and bacteria noted.  Based on his symptoms of urgency, appears consistent with urinary tract infection.  He is afebrile with normal heart rate.  Does report pain and spasm however.  He reports a history of allergy to codeine, he and his wife report he is been able to take other prescription pain medications in the past but does not know what they are.  Denies allergies to other pain medications and will trial tramadol.  In addition I will give Rocephin and plan to discharge him with cephalexin, both of which as well as tramadol I reviewed with Corene Cornea her pharmacist and they do not appear to have interaction with myasthenia.  Denies any symptoms associated with the myasthenia crisis.  His abdomen demonstrates no surgical signs or symptoms, but does demonstrate suprapubic tenderness.  Based on the patient's age and symptoms and out catheter was performed and he had only a few mL's of urine which was sent for culture and urinalysis.  Will perform a CT without contrast to further evaluate, provide pain medication, check CBC and metabolic panel  Clinical Course as of Nov 20 209  Sun Nov 20, 2016  0003 EKG pending, but suspect  hemolysis leading to elevated potassium but he does have evidence of acute kidney injury with an elevated BUN and marked decrease in his GFR as compared to his previous labs done on [facility] just about 10 days ago.  Given his poor oral intake and ongoing pain today I suspect he is dehydrated to some extent.  We will await his CT result, but based on acute kidney injury with probable urinary tract infection will admit the patient for close observation, initiate antibiotics including Rocephin.  [MQ]    Clinical Course User Index [MQ] Delman Kitten, MD    ----------------------------------------- 2:04 AM on 11/20/2016 -----------------------------------------  He has preparing for admission, the patient decompensated quite quickly approximately an hour and a half ago.  He began having increasing shortness of breath, which progressed rapidly with muscular weakness noticed in extremities as well.  Patient  began feeling short of breath, and clearly decompensated rapidly.  The patient was clearly having increased distress with elevated heart rate and need for supplemental oxygen with decreased respiratory drive, the patient was intubated.  Chest x-ray confirmed appropriate intubation, and he was placed on Versed and fentanyl for sedation.  The patient lost IV access shortly after intubation, he had to have a right tibial IO inserted by the nursing team.  Additionally, multiple attempts at peripheral IVs were unsuccessful by nursing team and myself.  Patient then had emergent placement of a right internal jugular central line.  Once adequate access has been obtained, [employer] team is now planning to provide fluid bolus, completion of antibiotics, his blood pressure and hemodynamics remain stable at present.  Blood pressure 138/89, heart rate 147 with EKG revealing sinus tachycardia.  Discussed with the patient's wife, as the patient had apparent myasthenic crisis as my impression we have had to arrange transfer  for him to go to [facility] for plasmapheresis which cannot be done here.  The patient was accepted to the ICU by Dr. Myrle Sheng, he is aware of the concerns of a potentially infected left kidney stone that led to the patient's presentation today as well as concerns for myasthenic crisis.  I personally gave report to the [facility] team at this time at the bedside.  They will be continuing his management in route to [facility].  The patient is deemed stable for transfer via critical care aeromedical service given the time sensitive nature of his illness and rapid decompensation. ____________________________________________   FINAL CLINICAL IMPRESSION(S) / ED DIAGNOSES  Final diagnoses:  Lower urinary tract infection, acute  SOB (shortness of breath)  Myasthenia gravis in crisis [facility])  Sepsis     Note:  This document was prepared using Dragon voice recognition software and may include unintentional dictation errors.     Delman Kitten, MD 11/20/16 Ninfa Meeker    Delman Kitten, MD 11/20/16 [phone]

## 2016-11-19 NOTE — ED Triage Notes (Signed)
Patient reports having bladder pain and nausea today.

## 2016-11-20 ENCOUNTER — Encounter: Payer: Self-pay | Admitting: Emergency Medicine

## 2016-11-20 ENCOUNTER — Emergency Department: Payer: Medicare Other

## 2016-11-20 DIAGNOSIS — N390 Urinary tract infection, site not specified: Secondary | ICD-10-CM | POA: Diagnosis not present

## 2016-11-20 LAB — BLOOD CULTURE ID PANEL (REFLEXED)
ACINETOBACTER BAUMANNII: NOT DETECTED
CANDIDA ALBICANS: NOT DETECTED
CANDIDA GLABRATA: NOT DETECTED
CANDIDA TROPICALIS: NOT DETECTED
Candida krusei: NOT DETECTED
Candida parapsilosis: NOT DETECTED
Carbapenem resistance: NOT DETECTED
ENTEROBACTER CLOACAE COMPLEX: NOT DETECTED
ENTEROBACTERIACEAE SPECIES: DETECTED — AB
ENTEROCOCCUS SPECIES: NOT DETECTED
ESCHERICHIA COLI: DETECTED — AB
HAEMOPHILUS INFLUENZAE: NOT DETECTED
Klebsiella oxytoca: NOT DETECTED
Klebsiella pneumoniae: NOT DETECTED
LISTERIA MONOCYTOGENES: NOT DETECTED
NEISSERIA MENINGITIDIS: NOT DETECTED
Proteus species: NOT DETECTED
Pseudomonas aeruginosa: NOT DETECTED
STREPTOCOCCUS AGALACTIAE: NOT DETECTED
STREPTOCOCCUS PNEUMONIAE: NOT DETECTED
STREPTOCOCCUS SPECIES: NOT DETECTED
Serratia marcescens: NOT DETECTED
Staphylococcus aureus (BCID): NOT DETECTED
Staphylococcus species: NOT DETECTED
Streptococcus pyogenes: NOT DETECTED

## 2016-11-20 LAB — BLOOD GAS, ARTERIAL
Acid-base deficit: 0.2 mmol/L (ref 0.0–2.0)
BICARBONATE: 22.8 mmol/L (ref 20.0–28.0)
FIO2: 0.5
MECHANICAL RATE: 12
O2 Saturation: 94.3 %
PEEP/CPAP: 5 cmH2O
PH ART: 7.46 — AB (ref 7.350–7.450)
PO2 ART: 68 mmHg — AB (ref 83.0–108.0)
Patient temperature: 37
VT: 500 mL
pCO2 arterial: 32 mmHg (ref 32.0–48.0)

## 2016-11-20 LAB — LACTIC ACID, PLASMA: LACTIC ACID, VENOUS: 2.7 mmol/L — AB (ref 0.5–1.9)

## 2016-11-20 LAB — POTASSIUM: POTASSIUM: 4.3 mmol/L (ref 3.5–5.1)

## 2016-11-20 LAB — GLUCOSE, CAPILLARY: GLUCOSE-CAPILLARY: 98 mg/dL (ref 65–99)

## 2016-11-20 MED ORDER — ETOMIDATE 2 MG/ML IV SOLN
INTRAVENOUS | Status: AC | PRN
  Administered 2016-11-20: 20 mg via INTRAVENOUS

## 2016-11-20 MED ORDER — FENTANYL CITRATE (PF) 100 MCG/2ML IJ SOLN
INTRAMUSCULAR | Status: AC
  Filled 2016-11-20: qty 2

## 2016-11-20 MED ORDER — MIDAZOLAM HCL 5 MG/5ML IJ SOLN
INTRAMUSCULAR | Status: AC
  Filled 2016-11-20: qty 5

## 2016-11-20 MED ORDER — FENTANYL 2500MCG IN NS 250ML (10MCG/ML) PREMIX INFUSION
20.0000 ug/h | INTRAVENOUS | Status: DC
  Administered 2016-11-20: 20 ug/h via INTRAVENOUS
  Filled 2016-11-20: qty 250

## 2016-11-20 MED ORDER — FENTANYL CITRATE (PF) 100 MCG/2ML IJ SOLN
50.0000 ug | Freq: Once | INTRAMUSCULAR | Status: AC
  Administered 2016-11-20: 50 ug via INTRAVENOUS

## 2016-11-20 MED ORDER — SODIUM CHLORIDE 0.9 % IV SOLN: 20.0000 ug/h | INTRAVENOUS | Status: DC

## 2016-11-20 MED ORDER — MIDAZOLAM HCL 2 MG/2ML IJ SOLN
2.0000 mg | Freq: Once | INTRAMUSCULAR | Status: AC
  Administered 2016-11-20: 2 mg via INTRAVENOUS

## 2016-11-20 MED ORDER — VECURONIUM BROMIDE 10 MG IV SOLR
INTRAVENOUS | Status: AC | PRN
  Administered 2016-11-20: 10 mg via INTRAVENOUS

## 2016-11-20 MED ORDER — SODIUM CHLORIDE 0.9 % IV SOLN
2.0000 mg/h | INTRAVENOUS | Status: DC
  Administered 2016-11-20: 2 mg/h via INTRAVENOUS
  Filled 2016-11-20: qty 10

## 2016-11-20 MED ORDER — ADENOSINE 6 MG/2ML IV SOLN
INTRAVENOUS | Status: AC
  Filled 2016-11-20: qty 4

## 2016-11-20 NOTE — ED Notes (Signed)
Pt vomiting yellow fluid and having BM.

## 2016-11-20 NOTE — ED Notes (Signed)
IV attempt unsuccessful to right upper chest.

## 2016-11-20 NOTE — ED Notes (Signed)
[facility] at bedside.

## 2016-11-20 NOTE — Progress Notes (Signed)
PHARMACY - PHYSICIAN COMMUNICATION CRITICAL VALUE ALERT - BLOOD CULTURE IDENTIFICATION (BCID)  Results for orders placed or performed during the hospital encounter of 11/19/16  Blood Culture ID Panel (Reflexed) (Collected: 11/20/2016  1:43 AM)  Result Value Ref Range   Enterococcus species NOT DETECTED NOT DETECTED   Listeria monocytogenes NOT DETECTED NOT DETECTED   Staphylococcus species NOT DETECTED NOT DETECTED   Staphylococcus aureus NOT DETECTED NOT DETECTED   Streptococcus species NOT DETECTED NOT DETECTED   Streptococcus agalactiae NOT DETECTED NOT DETECTED   Streptococcus pneumoniae NOT DETECTED NOT DETECTED   Streptococcus pyogenes NOT DETECTED NOT DETECTED   Acinetobacter baumannii NOT DETECTED NOT DETECTED   Enterobacteriaceae species DETECTED (A) NOT DETECTED   Enterobacter cloacae complex NOT DETECTED NOT DETECTED   Escherichia coli DETECTED (A) NOT DETECTED   Klebsiella oxytoca NOT DETECTED NOT DETECTED   Klebsiella pneumoniae NOT DETECTED NOT DETECTED   Proteus species NOT DETECTED NOT DETECTED   Serratia marcescens NOT DETECTED NOT DETECTED   Carbapenem resistance NOT DETECTED NOT DETECTED   Haemophilus influenzae NOT DETECTED NOT DETECTED   Neisseria meningitidis NOT DETECTED NOT DETECTED   Pseudomonas aeruginosa NOT DETECTED NOT DETECTED   Candida albicans NOT DETECTED NOT DETECTED   Candida glabrata NOT DETECTED NOT DETECTED   Candida krusei NOT DETECTED NOT DETECTED   Candida parapsilosis NOT DETECTED NOT DETECTED   Candida tropicalis NOT DETECTED NOT DETECTED    Name of physician (or Provider) Contacted: Patient was transferred to [place] contacted patient's nurse Sarita Haver, RN) at [facility]  ([phone]).  Nurse repeated blood cultures results back and said she would immediately contact patient's doctor to relay the results. Call back number was left with nurse.   Pernell Dupre, PharmD, BCPS Clinical  Pharmacist 11/20/2016 3:35 PM

## 2016-11-20 NOTE — ED Notes (Signed)
MD Quale at bedside. 

## 2016-11-20 NOTE — ED Notes (Signed)
MD Quale aware of pt's access difficulties. Pt becoming tachypnic and PR is increasing at this point.

## 2016-11-21 ENCOUNTER — Other Ambulatory Visit: Payer: Self-pay | Admitting: Urology

## 2016-11-22 LAB — URINE CULTURE
Culture: >=100000 — AB
Special Requests: NORMAL

## 2016-11-23 LAB — CULTURE, BLOOD (ROUTINE X 2)
SPECIAL REQUESTS: ADEQUATE
Special Requests: ADEQUATE

## 2016-11-23 NOTE — Progress Notes (Signed)
Urine cx results show ecoli resistent to several meds. Pt transferred to [place]. Called 8-E @ [facility] and spoke w/ pt Therapist, sports. Gave her cx results over the phone and also faxed over results to [phone]  Ramond Dial, Pharm.D, BCPS Clinical Pharmacist\

## 2016-11-24 LAB — BLOOD GAS, ARTERIAL
Acid-Base Excess: 0.1 mmol/L (ref 0.0–2.0)
Bicarbonate: 23.6 mmol/L (ref 20.0–28.0)
FIO2: 0.21
O2 SAT: 97.3 %
PATIENT TEMPERATURE: 37
pCO2 arterial: 34 mmHg (ref 32.0–48.0)
pH, Arterial: 7.45 (ref 7.350–7.450)
pO2, Arterial: 90 mmHg (ref 83.0–108.0)

## 2016-12-01 DEATH — deceased

## 2017-03-10 NOTE — Progress Notes (Deleted)
[facility]  [phone] [phone]  ID: Carloyn Jaeger OB: 1928-04-25  MR#: [id number]  NIO#:[id number]  Patient Care Team: [place] Pink, MD as PCP - General (Family Medicine) Ranee Gosselin, MD as Consulting Physician (Neurology)  CHIEF COMPLAINT: JAK-2 positive essential thrombocytosis.  INTERVAL HISTORY: Patient returns to clinic today for further evaluation and laboratory work. He continues to have extreme weakness and fatigue secondary to his diagnosis of myasthenia gravis. He has occasional dyspnea on exertion. He has no new neurologic complaints. He denies any recent fevers or illnesses. He denies any chest pain, cough, or hemoptysis. He denies any nausea, vomiting, or constipation. He has no urinary complaints. Patient offers no further specific complaints today.  REVIEW OF SYSTEMS:   Review of Systems  Constitutional: Positive for malaise/fatigue. Negative for fever and weight loss.  Respiratory: Positive for shortness of breath. Negative for cough and hemoptysis.   Cardiovascular: Negative.  Negative for chest pain.  Gastrointestinal: Negative for abdominal pain and diarrhea.  Genitourinary: Negative.   Musculoskeletal: Negative.   Neurological: Positive for focal weakness and weakness.  Endo/Heme/Allergies: Does not bruise/bleed easily.  Psychiatric/Behavioral: Negative.  Negative for depression. The patient is not nervous/anxious.    As per HPI. Otherwise, a complete review of systems is negative.   PAST MEDICAL HISTORY: Past Medical History:  Diagnosis Date  . Allergy   . Anxiety   . Arthritis    Osteoarthritis  . Bronchitis   . History of kidney cancer    Ablation surgery. Has a follow up 11/2014  . Hypertension    takes medication  . Kidney disease   . Macular degeneration   . Skin cancer     PAST SURGICAL HISTORY: Past Surgical History:  Procedure Laterality Date  . TRANSURETHRAL RESECTION OF PROSTATE       FAMILY HISTORY: Reviewed and unchanged. No reported history of malignancy or chronic disease.    ADVANCED DIRECTIVES:    HEALTH MAINTENANCE: Social History   Tobacco Use  . Smoking status: Former Smoker    Types: Cigarettes    Last attempt to quit: 02/01/1952    Years since quitting: 65.1  . Smokeless tobacco: Never Used  Substance Use Topics  . Alcohol use: No  . Drug use: No     Colonoscopy:  PAP:  Bone density:  Lipid panel:  Allergies  Allergen Reactions  . Other Other (See Comments) and Anaphylaxis    BEE STINGS BEE STINGS  . Adhesive [Tape]   . Ciprofloxacin   . Codeine   . Latex   . Levaquin [Levofloxacin In D5w]   . Quinolones Other (See Comments)  . Shellfish Allergy     Current Outpatient Medications  Medication Sig Dispense Refill  . ALPRAZolam (XANAX) 0.25 MG tablet Take 0.25 mg by mouth at bedtime as needed.     Marland Kitchen aspirin 81 MG tablet Take 81 mg by mouth daily.     . cephALEXin (KEFLEX) 500 MG capsule Take 1 capsule (500 mg total) by mouth 2 (two) times daily. 14 capsule 0  . citalopram (CELEXA) 10 MG tablet Take 10 mg by mouth daily.     . fexofenadine (ALLEGRA) 180 MG tablet Take 180 mg by mouth daily.     . fluticasone (FLONASE) 50 MCG/ACT nasal spray 2 sprays by Each Nare route daily.    . Fluticasone-Salmeterol (ADVAIR DISKUS) 100-50 MCG/DOSE AEPB PRN    . furosemide (LASIX) 40 MG tablet Take 40 mg by mouth daily.     Marland Kitchen  lisinopril (PRINIVIL,ZESTRIL) 10 MG tablet Take 10 mg by mouth.    Marland Kitchen omeprazole (PRILOSEC) 20 MG capsule Take 20 mg by mouth daily.    Marland Kitchen POTASSIUM CHLORIDE PO Take 1 tablet by mouth daily.     . tamsulosin (FLOMAX) 0.4 MG CAPS capsule Take 0.4 mg by mouth daily.     . traMADol (ULTRAM) 50 MG tablet Take 1 tablet (50 mg total) by mouth every 8 (eight) hours as needed. 12 tablet 0   No current facility-administered medications for this visit.     OBJECTIVE: There were no vitals filed for this visit.   There is no height or  weight on file to calculate BMI.    ECOG FS:1 - Symptomatic but completely ambulatory  General: Well-developed, well-nourished, no acute distress. Eyes: Pink conjunctiva, anicteric sclera. Lungs: Clear to auscultation bilaterally. Heart: Regular rate and rhythm. No rubs, murmurs, or gallops. Abdomen: Soft, nontender, nondistended. No organomegaly noted, normoactive bowel sounds. Musculoskeletal: No edema, cyanosis, or clubbing. Neuro: Alert, answering all questions appropriately. Cranial nerves grossly intact. Skin: No rashes or petechiae noted. Psych: Normal affect.   LAB RESULTS:  Lab Results  Component Value Date   NA 139 11/19/2016   K 4.3 11/20/2016   CL 103 11/19/2016   CO2 25 11/19/2016   GLUCOSE 145 (H) 11/19/2016   BUN 35 (H) 11/19/2016   CREATININE 1.79 (H) 11/19/2016   CALCIUM 8.9 11/19/2016   PROT 6.4 (L) 11/19/2016   ALBUMIN 3.5 11/19/2016   AST 27 11/19/2016   ALT 10 (L) 11/19/2016   ALKPHOS 72 11/19/2016   BILITOT 1.1 11/19/2016   GFRNONAA 33 (L) 11/19/2016   GFRAA 38 (L) 11/19/2016    Lab Results  Component Value Date   WBC 9.3 11/19/2016   NEUTROABS 8.7 (H) 11/19/2016   HGB 14.8 11/19/2016   HCT 44.2 11/19/2016   MCV 87.5 11/19/2016   PLT 433 11/19/2016   Lab Results  Component Value Date   IRON 44 (L) 08/31/2015   TIBC 263 08/31/2015   IRONPCTSAT 17 (L) 08/31/2015   Lab Results  Component Value Date   FERRITIN 91 08/31/2015     STUDIES: No results found.  ASSESSMENT: JAK-2 positive essential thrombocytosis.  PLAN:    1.JAK-2 positive essential thrombocytosis:  Patient's platelet count remains elevated, but essentially unchanged.  Patient noted to be positive for the JAK-2 mutation which puts him at increased risk for thrombotic complications. Continue 81 mg aspirin daily. The remainder of his laboratory work, except for a mild iron deficiency anemia, is either negative or within normal limits. Patient will likely require Hydrea 500 mg  daily in the future, but will hold off on prescribing at this point. If patient's platelet count trends up and approaches 1000, will consider treatment at that point. Return to clinic in 6 months with repeat laboratory work and further evaluation. 2. Myasthenia gravis: Continue evaluation and treatment with prednisone and plasmapheresis as per [facility] neurology. 3. Iron deficiency anemia: Mild, monitor.  Patient's neurologist:  Servando Salina, MD  [address]  [place], [place] [address]  [phone]  [phone] (Fax)    Approximately 30 minutes was spent in discussion of which greater than 50% was consultation.  Patient expressed understanding and was in agreement with this plan. He also understands that He can call clinic at any time with any questions, concerns, or complaints.   Lloyd Huger, MD   03/10/2017 10:47 PM

## 2017-03-15 ENCOUNTER — Inpatient Hospital Stay: Payer: Self-pay | Admitting: Oncology

## 2017-03-15 ENCOUNTER — Inpatient Hospital Stay: Payer: Self-pay

## 2017-09-12 IMAGING — CT CT CHEST W/O CM
1 series · 15 of 34 positions shown, 19 images · non-contrast
Comparison: None.

CLINICAL DATA: Recent diagnosis of myasthenia gravis. Short of
breath.

EXAM:
CT CHEST WITHOUT CONTRAST
TECHNIQUE: Multidetector CT imaging of the chest was performed following the
standard protocol without IV contrast.

[Series 2: thorax · axial · 0.77mm/px · z∈[-711,-459]mm · 15 of 148 slices shown, 19 images]
[im 11/148  mediastinal]
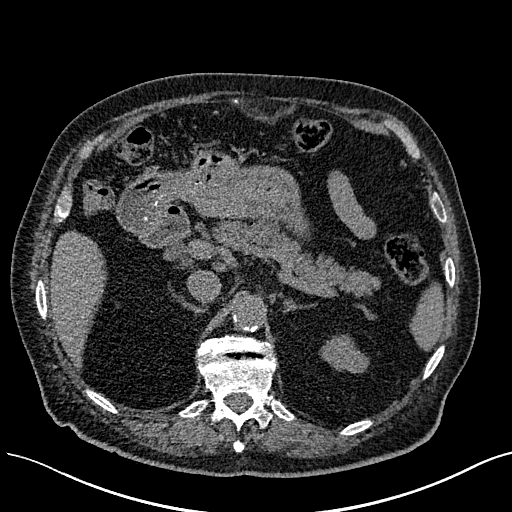
[im 11/148  lung]
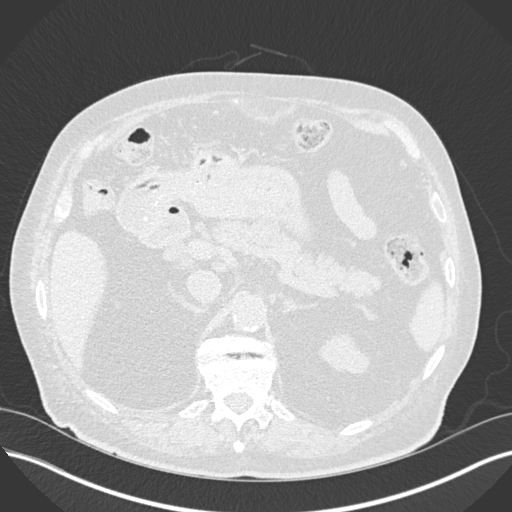
[im 22/148  lung]
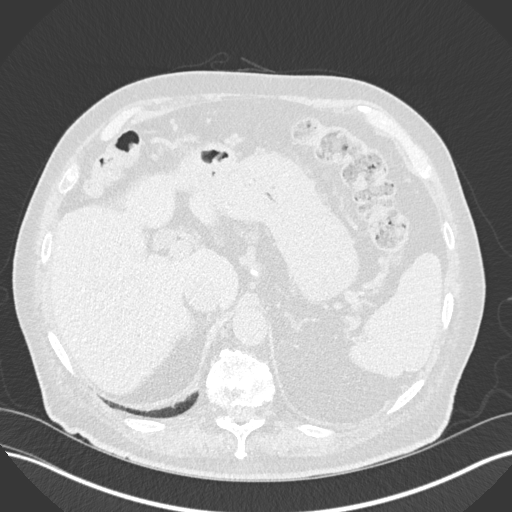
[im 30/148  lung]
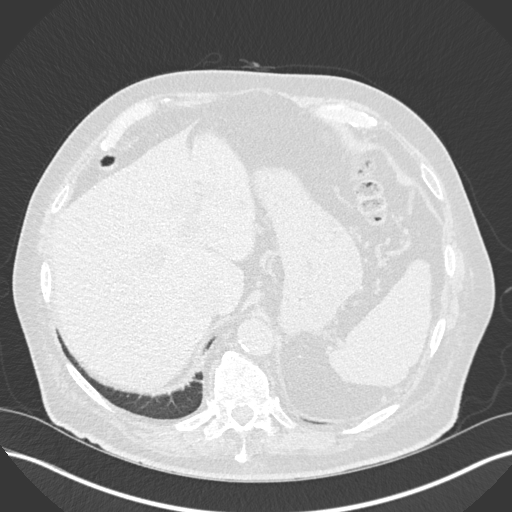
[im 39/148  lung]
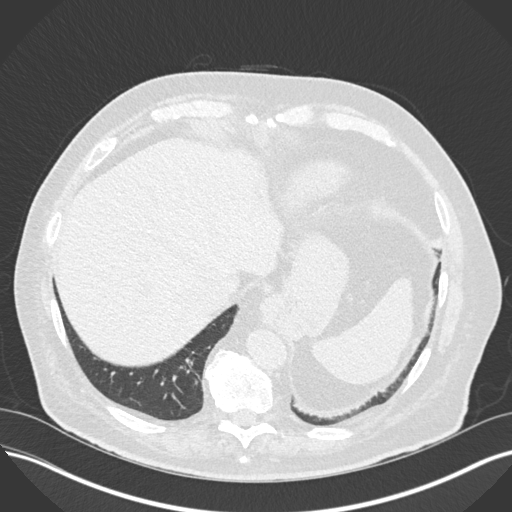
[im 50/148  mediastinal]
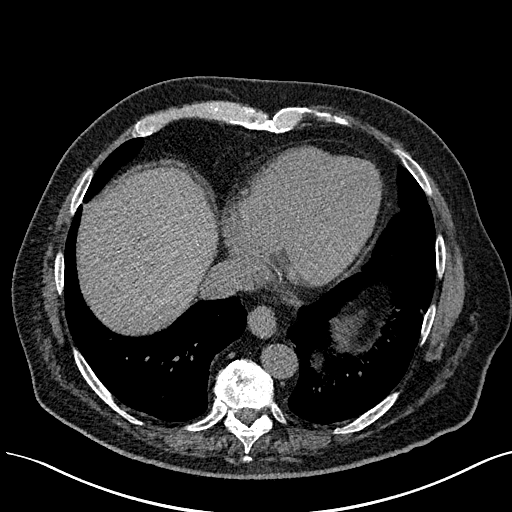
[im 50/148  lung]
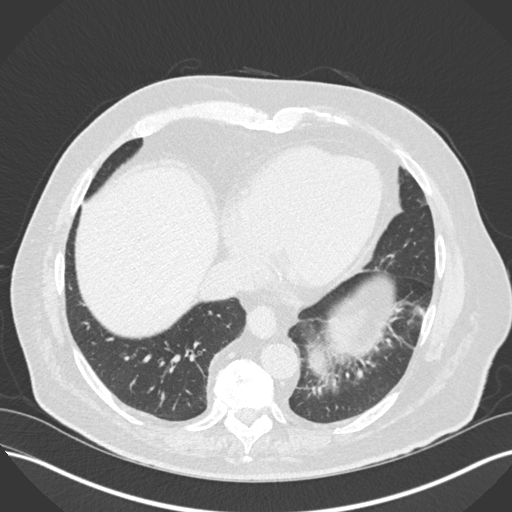
[im 59/148  lung]
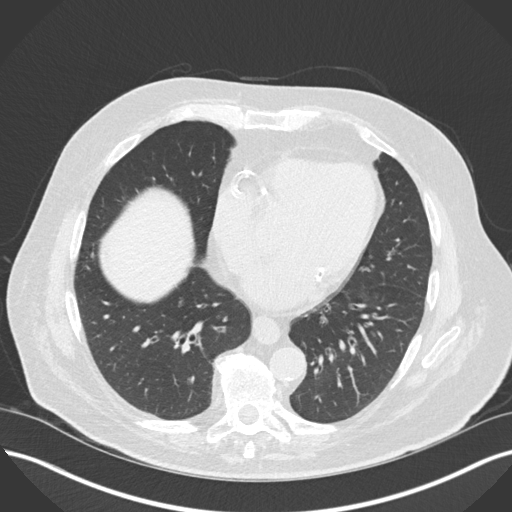
[im 66/148  lung]
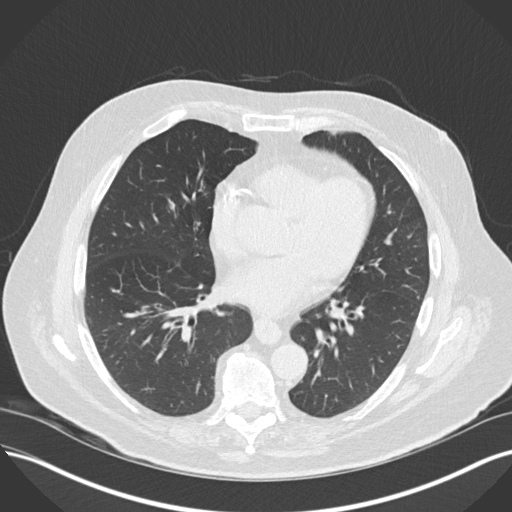
[im 77/148  lung]
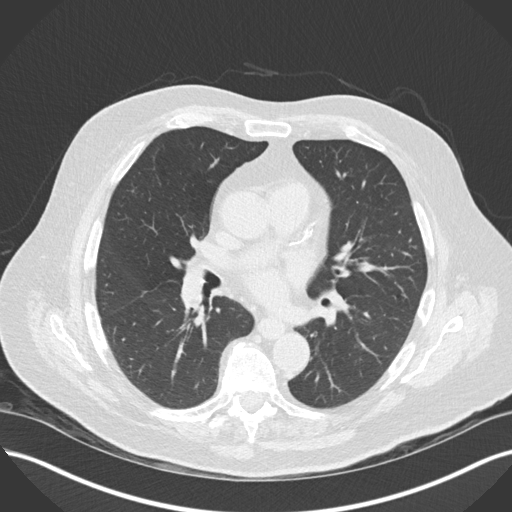
[im 82/148  mediastinal]
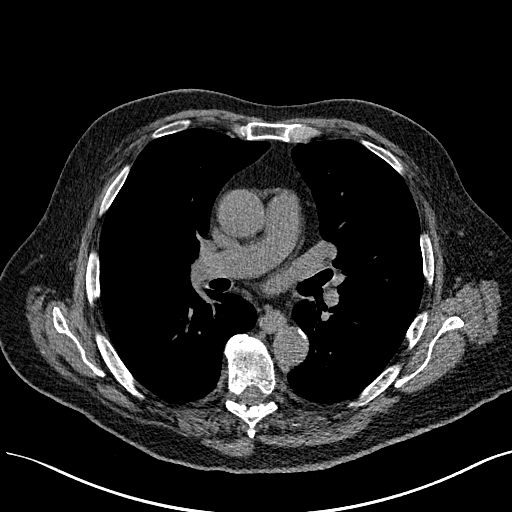
[im 82/148  lung]
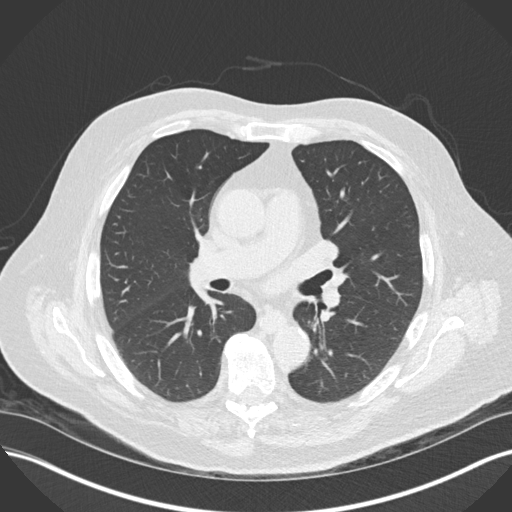
[im 89/148  lung]
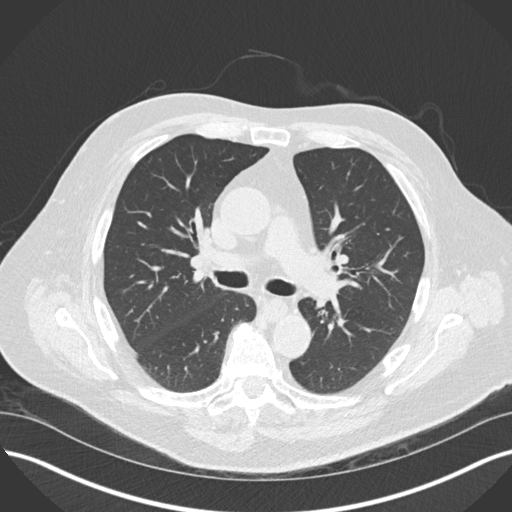
[im 99/148  lung]
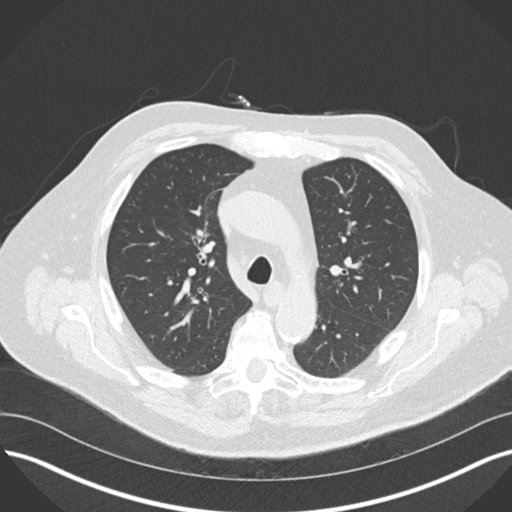
[im 109/148  lung]
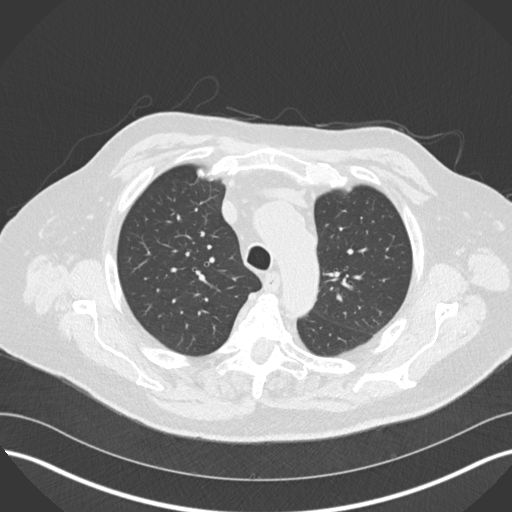
[im 118/148  mediastinal]
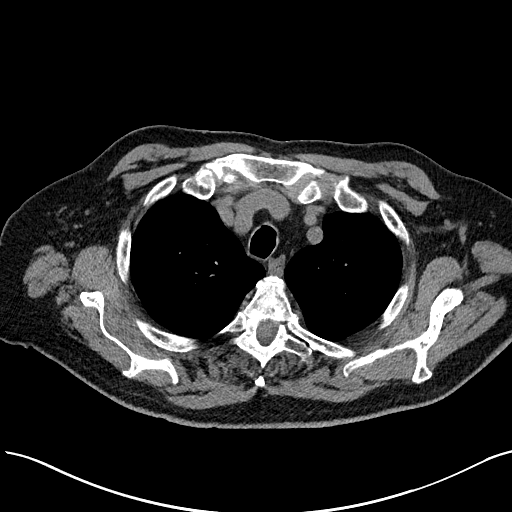
[im 118/148  lung]
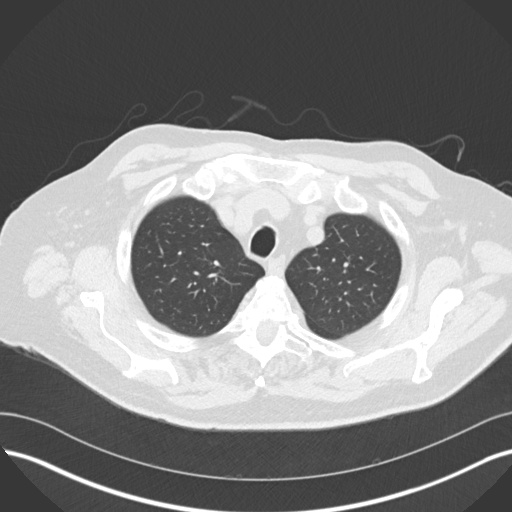
[im 126/148  lung]
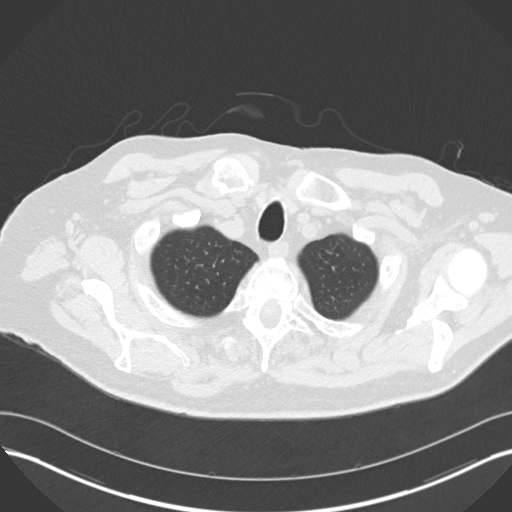
[im 137/148  lung]
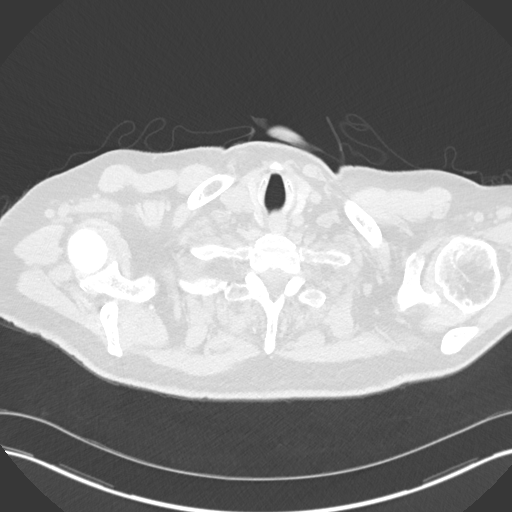

[15 of 34 positions shown; findings below may reference images not displayed]

FINDINGS: Cardiovascular: Normal heart size. No pericardial effusion. Coronary
artery atherosclerosis in the left main, lad, circumflex and RCA.
Normal caliber thoracic aorta. Thoracic aortic atherosclerosis.

Mediastinum/Nodes: No mediastinal lymphadenopathy. No hilar
lymphadenopathy. Normal esophagus. No mediastinal hematoma. No
mediastinal mass. No findings to suggest a thymoma.

Lungs/Pleura: No pleural effusion or pneumothorax. Mild fibrosis of
the left lung base with mild bibasilar bronchiectasis. No focal
consolidation.

Upper Abdomen: Upper abdomen is unremarkable.

Musculoskeletal: No acute osseous abnormality. No lytic or sclerotic
osseous lesion. Anterior bridging osteophytes of the mid thoracic
spine as can be seen with diffuse idiopathic skeletal hyperostosis.
IMPRESSION: 1. No acute cardiopulmonary disease.
2. No mediastinal mass.
